# Patient Record
Sex: Female | Born: 1935 | Race: White | Hispanic: No | Smoking: Never smoker
Health system: Southern US, Community
[De-identification: ages and names within clinical notes are randomized; demographics above are authoritative.]

## PROBLEM LIST (undated history)

## (undated) DIAGNOSIS — R011 Cardiac murmur, unspecified: Secondary | ICD-10-CM

## (undated) DIAGNOSIS — I10 Essential (primary) hypertension: Secondary | ICD-10-CM

## (undated) DIAGNOSIS — K439 Ventral hernia without obstruction or gangrene: Secondary | ICD-10-CM

## (undated) DIAGNOSIS — I350 Nonrheumatic aortic (valve) stenosis: Secondary | ICD-10-CM

## (undated) DIAGNOSIS — E785 Hyperlipidemia, unspecified: Secondary | ICD-10-CM

## (undated) DIAGNOSIS — K219 Gastro-esophageal reflux disease without esophagitis: Secondary | ICD-10-CM

## (undated) HISTORY — PX: OTHER SURGICAL HISTORY: SHX169

## (undated) HISTORY — DX: Essential (primary) hypertension: I10

## (undated) HISTORY — DX: Nonrheumatic aortic (valve) stenosis: I35.0

## (undated) HISTORY — DX: Hyperlipidemia, unspecified: E78.5

## (undated) HISTORY — DX: Gastro-esophageal reflux disease without esophagitis: K21.9

## (undated) HISTORY — PX: ANTERIOR CERVICAL DECOMP/DISCECTOMY FUSION: SHX1161

## (undated) HISTORY — PX: CHOLECYSTECTOMY: SHX55

---

## 1998-05-12 ENCOUNTER — Other Ambulatory Visit: Admission: RE | Admit: 1998-05-12 | Discharge: 1998-05-12 | Payer: Self-pay | Admitting: Family Medicine

## 1999-04-11 ENCOUNTER — Encounter (INDEPENDENT_AMBULATORY_CARE_PROVIDER_SITE_OTHER): Payer: Self-pay | Admitting: Specialist

## 1999-04-11 ENCOUNTER — Ambulatory Visit (HOSPITAL_COMMUNITY): Admission: RE | Admit: 1999-04-11 | Discharge: 1999-04-11 | Payer: Self-pay | Admitting: Gastroenterology

## 2001-02-13 ENCOUNTER — Encounter: Admission: RE | Admit: 2001-02-13 | Discharge: 2001-02-13 | Payer: Self-pay | Admitting: Family Medicine

## 2001-02-13 ENCOUNTER — Encounter: Payer: Self-pay | Admitting: Family Medicine

## 2002-02-19 ENCOUNTER — Encounter: Payer: Self-pay | Admitting: Family Medicine

## 2002-02-19 ENCOUNTER — Encounter: Admission: RE | Admit: 2002-02-19 | Discharge: 2002-02-19 | Payer: Self-pay | Admitting: Family Medicine

## 2004-07-18 ENCOUNTER — Ambulatory Visit (HOSPITAL_COMMUNITY): Admission: RE | Admit: 2004-07-18 | Discharge: 2004-07-18 | Payer: Self-pay | Admitting: Gastroenterology

## 2006-08-11 ENCOUNTER — Emergency Department (HOSPITAL_COMMUNITY): Admission: EM | Admit: 2006-08-11 | Discharge: 2006-08-11 | Payer: Self-pay | Admitting: Emergency Medicine

## 2007-01-13 ENCOUNTER — Encounter: Admission: RE | Admit: 2007-01-13 | Discharge: 2007-01-13 | Payer: Self-pay | Admitting: *Deleted

## 2007-02-08 ENCOUNTER — Encounter (INDEPENDENT_AMBULATORY_CARE_PROVIDER_SITE_OTHER): Payer: Self-pay | Admitting: Specialist

## 2007-02-08 ENCOUNTER — Ambulatory Visit (HOSPITAL_COMMUNITY): Admission: RE | Admit: 2007-02-08 | Discharge: 2007-02-09 | Payer: Self-pay | Admitting: General Surgery

## 2010-06-27 ENCOUNTER — Ambulatory Visit: Payer: Self-pay | Admitting: Cardiology

## 2010-09-21 ENCOUNTER — Inpatient Hospital Stay (HOSPITAL_COMMUNITY): Admission: EM | Admit: 2010-09-21 | Discharge: 2010-09-12 | Payer: Self-pay | Admitting: Emergency Medicine

## 2010-12-26 LAB — CBC
HCT: 31.8 % — ABNORMAL LOW (ref 36.0–46.0)
HCT: 41.3 % (ref 36.0–46.0)
Hemoglobin: 13.9 g/dL (ref 12.0–15.0)
Hemoglobin: 14.1 g/dL (ref 12.0–15.0)
MCH: 32.1 pg (ref 26.0–34.0)
MCH: 32.9 pg (ref 26.0–34.0)
MCHC: 33.4 g/dL (ref 30.0–36.0)
MCHC: 34.2 g/dL (ref 30.0–36.0)
MCHC: 34.3 g/dL (ref 30.0–36.0)
MCV: 95.6 fL (ref 78.0–100.0)
MCV: 95.7 fL (ref 78.0–100.0)
MCV: 96.1 fL (ref 78.0–100.0)
Platelets: 226 10*3/uL (ref 150–400)
RBC: 3.53 MIL/uL — ABNORMAL LOW (ref 3.87–5.11)
RBC: 4.32 MIL/uL (ref 3.87–5.11)
RDW: 13.6 % (ref 11.5–15.5)
RDW: 13.9 % (ref 11.5–15.5)

## 2010-12-26 LAB — LIPID PANEL
Cholesterol: 203 mg/dL — ABNORMAL HIGH (ref 0–200)
HDL: 60 mg/dL (ref >39)
LDL Cholesterol: 127 mg/dL — ABNORMAL HIGH (ref 0–99)
Total CHOL/HDL Ratio: 3.4 RATIO
Triglycerides: 80 mg/dL (ref <150)
VLDL: 16 mg/dL (ref 0–40)

## 2010-12-26 LAB — POCT I-STAT, CHEM 8
Calcium, Ion: 1.06 mmol/L — ABNORMAL LOW (ref 1.12–1.32)
Chloride: 111 mEq/L (ref 96–112)
Creatinine, Ser: 0.9 mg/dL (ref 0.4–1.2)
Glucose, Bld: 186 mg/dL — ABNORMAL HIGH (ref 70–99)
Potassium: 3.7 mEq/L (ref 3.5–5.1)

## 2010-12-26 LAB — BASIC METABOLIC PANEL
BUN: 8 mg/dL (ref 6–23)
Chloride: 112 mEq/L (ref 96–112)
Glucose, Bld: 115 mg/dL — ABNORMAL HIGH (ref 70–99)
Potassium: 4.1 mEq/L (ref 3.5–5.1)

## 2010-12-26 LAB — DIFFERENTIAL
Basophils Absolute: 0 10*3/uL (ref 0.0–0.1)
Basophils Relative: 0 % (ref 0–1)
Eosinophils Relative: 0 % (ref 0–5)
Lymphocytes Relative: 18 % (ref 12–46)
Monocytes Absolute: 0.4 10*3/uL (ref 0.1–1.0)
Monocytes Relative: 4 % (ref 3–12)
Neutro Abs: 8.3 10*3/uL — ABNORMAL HIGH (ref 1.7–7.7)

## 2010-12-26 LAB — COMPREHENSIVE METABOLIC PANEL
ALT: 26 U/L (ref 0–35)
AST: 28 U/L (ref 0–37)
Albumin: 3.1 g/dL — ABNORMAL LOW (ref 3.5–5.2)
BUN: 16 mg/dL (ref 6–23)
CO2: 23 mEq/L (ref 19–32)
Calcium: 8.5 mg/dL (ref 8.4–10.5)
Calcium: 8.7 mg/dL (ref 8.4–10.5)
Chloride: 111 mEq/L (ref 96–112)
Creatinine, Ser: 0.97 mg/dL (ref 0.4–1.2)
Creatinine, Ser: 0.99 mg/dL (ref 0.4–1.2)
GFR calc Af Amer: >60 mL/min (ref >60)
GFR calc non Af Amer: 53 mL/min — ABNORMAL LOW (ref >60)
Glucose, Bld: 112 mg/dL — ABNORMAL HIGH (ref 70–99)
Sodium: 142 mEq/L (ref 135–145)
Sodium: 142 mEq/L (ref 135–145)
Total Bilirubin: 0.7 mg/dL (ref 0.3–1.2)

## 2010-12-26 LAB — PROTIME-INR
INR: 1.01 (ref 0.00–1.49)
Prothrombin Time: 13.5 seconds (ref 11.6–15.2)

## 2011-03-02 NOTE — Op Note (Signed)
[name], [name]              ACCOUNT NO.:  [id number]   MEDICAL RECORD NO.:  [id number]          PATIENT TYPE:  AMB   LOCATION:  ENDO                         FACILITY:  [facility]   PHYSICIAN:  Petra Kuba, M.D.    DATE OF BIRTH:  July 30, 1918   DATE OF PROCEDURE:  07/18/2004  DATE OF DISCHARGE:                                 OPERATIVE REPORT   PROCEDURE:  Colonoscopy.   INDICATIONS:  Patient with history of colon polyps.  Due for repeat  screening.  Consent was signed after risks, benefits, methods, options were  thoroughly discussed in the office multiple times in the past.   MEDICINES USED:  1.  Demerol 40.  2.  Versed 4.   PROCEDURE:  Rectal inspection was pertinent for external hemorrhoids, small.  Digital exam was negative.  Video pediatric adjustable colonoscope was  inserted and with various abdominal pressures able to be advanced around the  colon to the cecum.  No  abnormality was seen on insertion.  The cecum was  identified by the appendiceal orifice and the ileocecal valve.  The prep on  the right side did have some formed stool which could be washed and  suctioned to other areas of the colon.  Prep on the left was adequate.  On  slow withdraw through the colon, no abnormalities were seen, specifically no  polyps, tumors, masses, or diverticula.  Once back in the rectum, anorectal  pullthrough and retroflexion confirmed some small hemorrhoids.  The scope  was reinserted a short ways up the left side of the colon, air was  suctioned, and the scope removed.  The patient tolerated the procedure well.  There was no obvious immediate complication.   ENDOSCOPIC DIAGNOSES:  1.  Internal and external hemorrhoids.  2.  Otherwise within normal limits to the cecum.   PLAN:  I would be happy to see her back p.r.n.  Otherwise, return the  customary medical to Dr. Idell Pickles.  I doubt based on her age that she is a  candidate for repeat screening, but might consider it in five  years if doing  well, and thought she might live another 10 years.     MEM/MEDQ  D:  07/18/2004  T:  07/18/2004  Job:  [id number]   cc:   Dellis Anes. Idell Pickles, M.D.  [address]  [place]  [place] [address]  Fax: [phone]

## 2011-03-02 NOTE — Op Note (Signed)
[name], [name]              ACCOUNT NO.:  [id number]   MEDICAL RECORD NO.:  [id number]          PATIENT TYPE:  OIB   LOCATION:  0098                         FACILITY:  [facility]   PHYSICIAN:  Anselm Pancoast. Weatherly, M.D.DATE OF BIRTH:  06/13/1918   DATE OF PROCEDURE:  02/08/2007  DATE OF DISCHARGE:                               OPERATIVE REPORT   PREOPERATIVE DIAGNOSIS:  Chronic cholecystitis with stones.   OPERATION:  Laparoscopic cholecystectomy with cholangiogram. General  anesthesia.   HISTORY:  [name] is an 77 year old retired Engineer, site who  was referred through our office this past week with the following  symptoms. She has had several episodes of epigastric pain, one in I  think December, and she was actually hospitalization since it was within  her chest and had an extensive cardiac evaluation with no acute findings  noted. Afterwards, she was released and then had several episodes less  intense, and then approximately two weeks had an episode after eating at  [employer]. At this time, her regular medical physician sent her for an  ultrasound that showed she did have gallstones, and she was seen in our  office I think on Wednesday. Dr. Melvyn Novas is her regular medical  physician. On examination when I saw her on Wednesday, she was not  acutely ill, maybe mild tenderness in the upper abdomen but certainly  not any peritoneal findings. She is planned to care for her daughter's  children while her daughter goes on a cruise in approximately a week,  and since she has had repeated episodes of pain, I recommended that we  add her to the OR schedule for today, so hopefully she will be able to  manage the children and also unlikely to have another gallbladder attack  since she has had frequent episodes these past three months. The patient  this morning was not acutely ill.   Her laboratory studies showed basically a normal white cell count of  6800. Her hematocrit  is 39, and her liver function studies were all  normal. The patient was taken to the operating suite. She had been given  3 grams of Unasyn, had PAS stockings, positioned on the OR table and  anesthesia with endotracheal tube, an oral tube placed into the stomach,  and the abdomen was prepped with Betadine solution and draped in a  sterile manner. I do not think she has had any previous upper abdominal  surgeries, and a small incision was made below the umbilicus. Her fascia  is fairly thin but kind of a moderate adipose tissue, and we carefully  entered into the peritoneal cavity. A pursestring suture of 0 Vicryl was  placed and Hasson cannula introduced. The gallbladder was big and floppy  with adhesions around it but not acutely inflamed, and the upper 10-mm  trocar was placed in the subxiphoid area, and the two lateral 5 mm  placed laterally. The gallbladder was retracted upward and outward, and  the adhesions were carefully taken down. The duodenum was kind of  adherent to this, but with freeing up these chronic adhesions, I could  see  the cystic duct/gallbladder junction nicely. This was encompassed  with a right angle, a clip placed flush with the gallbladder,  cholangiocatheter entered proximally, and a cholangiogram obtained. The  biliary system is slightly dilated, but there is flow into the duodenum,  and there is no evidence of any stones in the distal common bile duct.  Her cystic duct is large enough that she certainly could have passed a  small gallstone when she was having the pain in December. The catheter  was removed. The cystic duct was triply clipped and then divided, and  the cystic artery was identified, doubly clipped proximally and singly  distally divided, and then the gallbladder was freed from its bed. There  was a little bleeding from the liver bed up at the very distal portion  that was likely coagulated, the gallbladder placed in an EndoCatch bag  and then  the cameras switched to the upper 10-mm port, and the bag  containing the gallbladder withdrawn. I put an additional figure-of-  eight suture of 0 Vicryl in the fascia at the umbilicus and anesthetized  it. The upper 10-mm trocar after irrigation all of the irrigating fluid  and all was removed. There was no evidence of any bleeding, and the 5-mm  ports were drawn, the carbon dioxide released, and the upper 10-mm  trocar withdrawn under direct vision. I then put a figure-of-eight  suture in the fascia at the subxiphoid area,  closed the subcutaneous wounds with 4-0 Vicryl, Benzoin and Steri-Strips  on the skin. The patient tolerated the procedure nicely and was  extubated and sent to the recovery room in stable postoperative  condition. We are going to keep her today, mainly because of her age,  and she should be able to be released in the a.m. Sunday.           ______________________________  Anselm Pancoast. Zachery Dakins, M.D.     WJW/MEDQ  D:  02/08/2007  T:  02/08/2007  Job:  [id number]   cc:   Melvyn Novas, M.D.   Anselm Pancoast. Zachery Dakins, M.D.  [address], [address]  [place]  [place] [address]

## 2011-03-13 ENCOUNTER — Emergency Department (HOSPITAL_COMMUNITY): Payer: Medicare Other

## 2011-03-13 ENCOUNTER — Inpatient Hospital Stay (HOSPITAL_COMMUNITY): Payer: Medicare Other

## 2011-03-13 ENCOUNTER — Emergency Department (HOSPITAL_COMMUNITY)
Admission: EM | Admit: 2011-03-13 | Discharge: 2011-03-13 | Disposition: A | Discharge status: Other Acute Inpatient Hospital | Payer: Medicare Other | Source: Home / Self Care | Attending: Emergency Medicine | Admitting: Emergency Medicine

## 2011-03-13 ENCOUNTER — Inpatient Hospital Stay (HOSPITAL_COMMUNITY)
Admission: EM | Admit: 2011-03-13 | Discharge: 2011-03-20 | DRG: 029 | Disposition: A | Discharge status: Rehabilitation Facility | Payer: Medicare Other | Attending: Neurosurgery | Admitting: Neurosurgery

## 2011-03-13 DIAGNOSIS — E785 Hyperlipidemia, unspecified: Secondary | ICD-10-CM | POA: Insufficient documentation

## 2011-03-13 DIAGNOSIS — Z79899 Other long term (current) drug therapy: Secondary | ICD-10-CM

## 2011-03-13 DIAGNOSIS — Y998 Other external cause status: Secondary | ICD-10-CM | POA: Insufficient documentation

## 2011-03-13 DIAGNOSIS — M25539 Pain in unspecified wrist: Secondary | ICD-10-CM | POA: Insufficient documentation

## 2011-03-13 DIAGNOSIS — W06XXXA Fall from bed, initial encounter: Secondary | ICD-10-CM | POA: Insufficient documentation

## 2011-03-13 DIAGNOSIS — K219 Gastro-esophageal reflux disease without esophagitis: Secondary | ICD-10-CM | POA: Diagnosis present

## 2011-03-13 DIAGNOSIS — I498 Other specified cardiac arrhythmias: Secondary | ICD-10-CM | POA: Diagnosis not present

## 2011-03-13 DIAGNOSIS — I959 Hypotension, unspecified: Secondary | ICD-10-CM | POA: Diagnosis not present

## 2011-03-13 DIAGNOSIS — I359 Nonrheumatic aortic valve disorder, unspecified: Secondary | ICD-10-CM | POA: Diagnosis present

## 2011-03-13 DIAGNOSIS — M4712 Other spondylosis with myelopathy, cervical region: Secondary | ICD-10-CM | POA: Diagnosis present

## 2011-03-13 DIAGNOSIS — Z85820 Personal history of malignant melanoma of skin: Secondary | ICD-10-CM

## 2011-03-13 DIAGNOSIS — M79609 Pain in unspecified limb: Secondary | ICD-10-CM | POA: Insufficient documentation

## 2011-03-13 DIAGNOSIS — R51 Headache: Secondary | ICD-10-CM | POA: Insufficient documentation

## 2011-03-13 DIAGNOSIS — K449 Diaphragmatic hernia without obstruction or gangrene: Secondary | ICD-10-CM | POA: Diagnosis present

## 2011-03-13 DIAGNOSIS — S12400A Unspecified displaced fracture of fifth cervical vertebra, initial encounter for closed fracture: Secondary | ICD-10-CM | POA: Insufficient documentation

## 2011-03-13 DIAGNOSIS — IMO0002 Reserved for concepts with insufficient information to code with codable children: Principal | ICD-10-CM | POA: Diagnosis present

## 2011-03-13 LAB — URINALYSIS, ROUTINE W REFLEX MICROSCOPIC
Bilirubin Urine: NEGATIVE
Glucose, UA: NEGATIVE mg/dL
Ketones, ur: NEGATIVE mg/dL
Leukocytes, UA: NEGATIVE
Nitrite: NEGATIVE
Protein, ur: NEGATIVE mg/dL
Specific Gravity, Urine: 1.014 (ref 1.005–1.030)
Urobilinogen, UA: 0.2 mg/dL (ref 0.0–1.0)
pH: 6 (ref 5.0–8.0)

## 2011-03-13 LAB — BASIC METABOLIC PANEL
BUN: 15 mg/dL (ref 6–23)
CO2: 24 mEq/L (ref 19–32)
CO2: 24 mEq/L (ref 19–32)
Calcium: 9.2 mg/dL (ref 8.4–10.5)
Calcium: 8.2 mg/dL — ABNORMAL LOW (ref 8.4–10.5)
Chloride: 106 mEq/L (ref 96–112)
Chloride: 102 mEq/L (ref 96–112)
Creatinine, Ser: 0.76 mg/dL (ref 0.4–1.2)
GFR calc Af Amer: >60 mL/min (ref >60)
Glucose, Bld: 135 mg/dL — ABNORMAL HIGH (ref 70–99)
Glucose, Bld: 149 mg/dL — ABNORMAL HIGH (ref 70–99)
Potassium: 3.6 mEq/L (ref 3.5–5.1)
Sodium: 137 mEq/L (ref 135–145)

## 2011-03-13 LAB — CARDIAC PANEL(CRET KIN+CKTOT+MB+TROPI)
CK, MB: 32.2 ng/mL (ref 0.3–4.0)
Relative Index: 1.7 (ref 0.0–2.5)
Total CK: 1937 U/L — ABNORMAL HIGH (ref 7–177)

## 2011-03-13 LAB — DIFFERENTIAL
Basophils Absolute: 0 K/uL (ref 0.0–0.1)
Basophils Relative: 0 % (ref 0–1)
Eosinophils Absolute: 0 10*3/uL (ref 0.0–0.7)
Eosinophils Relative: 0 % (ref 0–5)
Lymphocytes Relative: 7 % — ABNORMAL LOW (ref 12–46)
Lymphs Abs: 1.1 10*3/uL (ref 0.7–4.0)
Monocytes Absolute: 1.1 10*3/uL — ABNORMAL HIGH (ref 0.1–1.0)
Monocytes Relative: 6 % (ref 3–12)
Neutro Abs: 14.6 K/uL — ABNORMAL HIGH (ref 1.7–7.7)
Neutrophils Relative %: 87 % — ABNORMAL HIGH (ref 43–77)

## 2011-03-13 LAB — BASIC METABOLIC PANEL WITH GFR
GFR calc Af Amer: >60 mL/min (ref >60)
GFR calc non Af Amer: >60 mL/min (ref >60)
Potassium: 3.4 meq/L — ABNORMAL LOW (ref 3.5–5.1)
Sodium: 142 meq/L (ref 135–145)

## 2011-03-13 LAB — CBC
HCT: 37.8 % (ref 36.0–46.0)
Hemoglobin: 12.7 g/dL (ref 12.0–15.0)
MCH: 30.7 pg (ref 26.0–34.0)
MCHC: 33.6 g/dL (ref 30.0–36.0)
MCV: 91.3 fL (ref 78.0–100.0)
Platelets: 236 10*3/uL (ref 150–400)
RBC: 4.14 MIL/uL (ref 3.87–5.11)
RDW: 14.3 % (ref 11.5–15.5)
WBC: 16.9 K/uL — ABNORMAL HIGH (ref 4.0–10.5)

## 2011-03-13 LAB — SAMPLE TO BLOOD BANK

## 2011-03-13 LAB — URINE MICROSCOPIC-ADD ON

## 2011-03-13 LAB — PROTIME-INR: INR: 0.97 (ref 0.00–1.49)

## 2011-03-13 LAB — PHOSPHORUS: Phosphorus: 3.6 mg/dL (ref 2.3–4.6)

## 2011-03-14 DIAGNOSIS — I498 Other specified cardiac arrhythmias: Secondary | ICD-10-CM

## 2011-03-14 DIAGNOSIS — I959 Hypotension, unspecified: Secondary | ICD-10-CM

## 2011-03-14 LAB — LACTIC ACID, PLASMA: Lactic Acid, Venous: 1 mmol/L (ref 0.5–2.2)

## 2011-03-14 LAB — BLOOD GAS, ARTERIAL
Acid-base deficit: 0.7 mmol/L (ref 0.0–2.0)
O2 Saturation: 96 %
TCO2: 24.7 mmol/L (ref 0–100)
pCO2 arterial: 39.5 mmHg (ref 35.0–45.0)

## 2011-03-14 LAB — TSH: TSH: 0.658 u[IU]/mL (ref 0.350–4.500)

## 2011-03-15 DIAGNOSIS — IMO0002 Reserved for concepts with insufficient information to code with codable children: Secondary | ICD-10-CM

## 2011-03-15 LAB — CARDIAC PANEL(CRET KIN+CKTOT+MB+TROPI)
CK, MB: 5 ng/mL — ABNORMAL HIGH (ref 0.3–4.0)
Relative Index: 0.4 (ref 0.0–2.5)
Troponin I: <0.3 ng/mL (ref <0.30)

## 2011-03-15 LAB — CBC
Hemoglobin: 10.5 g/dL — ABNORMAL LOW (ref 12.0–15.0)
MCV: 92.4 fL (ref 78.0–100.0)
Platelets: 211 10*3/uL (ref 150–400)
RBC: 3.29 MIL/uL — ABNORMAL LOW (ref 3.87–5.11)
WBC: 15.1 10*3/uL — ABNORMAL HIGH (ref 4.0–10.5)

## 2011-03-15 LAB — CORTISOL: Cortisol, Plasma: 20.4 ug/dL

## 2011-03-15 LAB — BASIC METABOLIC PANEL
BUN: 11 mg/dL (ref 6–23)
Creatinine, Ser: 0.59 mg/dL (ref 0.4–1.2)
GFR calc non Af Amer: >60 mL/min (ref >60)

## 2011-03-16 DIAGNOSIS — I498 Other specified cardiac arrhythmias: Secondary | ICD-10-CM

## 2011-03-19 ENCOUNTER — Inpatient Hospital Stay (HOSPITAL_COMMUNITY): Payer: Medicare Other

## 2011-03-20 ENCOUNTER — Inpatient Hospital Stay (HOSPITAL_COMMUNITY)
Admission: RE | Admit: 2011-03-20 | Discharge: 2011-04-03 | DRG: 945 | Disposition: A | Discharge status: Home Health | Payer: Medicare Other | Source: Other Acute Inpatient Hospital | Attending: Physical Medicine & Rehabilitation | Admitting: Physical Medicine & Rehabilitation

## 2011-03-20 DIAGNOSIS — I4891 Unspecified atrial fibrillation: Secondary | ICD-10-CM

## 2011-03-20 DIAGNOSIS — K5900 Constipation, unspecified: Secondary | ICD-10-CM

## 2011-03-20 DIAGNOSIS — N390 Urinary tract infection, site not specified: Secondary | ICD-10-CM

## 2011-03-20 DIAGNOSIS — K219 Gastro-esophageal reflux disease without esophagitis: Secondary | ICD-10-CM

## 2011-03-20 DIAGNOSIS — N319 Neuromuscular dysfunction of bladder, unspecified: Secondary | ICD-10-CM

## 2011-03-20 DIAGNOSIS — I359 Nonrheumatic aortic valve disorder, unspecified: Secondary | ICD-10-CM

## 2011-03-20 DIAGNOSIS — E785 Hyperlipidemia, unspecified: Secondary | ICD-10-CM

## 2011-03-20 DIAGNOSIS — Z5189 Encounter for other specified aftercare: Secondary | ICD-10-CM

## 2011-03-20 DIAGNOSIS — Z981 Arthrodesis status: Secondary | ICD-10-CM

## 2011-03-20 DIAGNOSIS — I959 Hypotension, unspecified: Secondary | ICD-10-CM

## 2011-03-20 DIAGNOSIS — B964 Proteus (mirabilis) (morganii) as the cause of diseases classified elsewhere: Secondary | ICD-10-CM

## 2011-03-20 DIAGNOSIS — K592 Neurogenic bowel, not elsewhere classified: Secondary | ICD-10-CM

## 2011-03-20 DIAGNOSIS — W06XXXA Fall from bed, initial encounter: Secondary | ICD-10-CM

## 2011-03-20 DIAGNOSIS — I498 Other specified cardiac arrhythmias: Secondary | ICD-10-CM

## 2011-03-20 DIAGNOSIS — E876 Hypokalemia: Secondary | ICD-10-CM

## 2011-03-20 DIAGNOSIS — IMO0002 Reserved for concepts with insufficient information to code with codable children: Secondary | ICD-10-CM

## 2011-03-20 LAB — URINALYSIS, MICROSCOPIC ONLY
Glucose, UA: NEGATIVE mg/dL
Ketones, ur: NEGATIVE mg/dL
Protein, ur: 30 mg/dL — AB
Urobilinogen, UA: 0.2 mg/dL (ref 0.0–1.0)

## 2011-03-21 DIAGNOSIS — K592 Neurogenic bowel, not elsewhere classified: Secondary | ICD-10-CM

## 2011-03-21 DIAGNOSIS — N319 Neuromuscular dysfunction of bladder, unspecified: Secondary | ICD-10-CM

## 2011-03-21 DIAGNOSIS — IMO0002 Reserved for concepts with insufficient information to code with codable children: Secondary | ICD-10-CM

## 2011-03-21 DIAGNOSIS — I4891 Unspecified atrial fibrillation: Secondary | ICD-10-CM

## 2011-03-21 DIAGNOSIS — Z5189 Encounter for other specified aftercare: Secondary | ICD-10-CM

## 2011-03-21 LAB — COMPREHENSIVE METABOLIC PANEL
ALT: 26 U/L (ref 0–35)
AST: 16 U/L (ref 0–37)
CO2: 29 mEq/L (ref 19–32)
Chloride: 106 mEq/L (ref 96–112)
GFR calc Af Amer: >60 mL/min (ref >60)
GFR calc non Af Amer: >60 mL/min (ref >60)
Glucose, Bld: 90 mg/dL (ref 70–99)
Sodium: 143 mEq/L (ref 135–145)
Total Bilirubin: 0.3 mg/dL (ref 0.3–1.2)

## 2011-03-21 LAB — CBC
HCT: 30.9 % — ABNORMAL LOW (ref 36.0–46.0)
Hemoglobin: 10.3 g/dL — ABNORMAL LOW (ref 12.0–15.0)
MCH: 31.1 pg (ref 26.0–34.0)
RBC: 3.31 MIL/uL — ABNORMAL LOW (ref 3.87–5.11)

## 2011-03-21 LAB — DIFFERENTIAL
Basophils Absolute: 0.1 10*3/uL (ref 0.0–0.1)
Basophils Relative: 0 % (ref 0–1)
Lymphocytes Relative: 24 % (ref 12–46)
Monocytes Absolute: 1.3 10*3/uL — ABNORMAL HIGH (ref 0.1–1.0)
Monocytes Relative: 11 % (ref 3–12)
Neutro Abs: 7.1 10*3/uL (ref 1.7–7.7)
Neutrophils Relative %: 61 % (ref 43–77)

## 2011-03-22 LAB — BASIC METABOLIC PANEL
Chloride: 106 mEq/L (ref 96–112)
GFR calc Af Amer: >60 mL/min (ref >60)
Potassium: 3.5 mEq/L (ref 3.5–5.1)
Sodium: 141 mEq/L (ref 135–145)

## 2011-03-22 LAB — URINE CULTURE

## 2011-03-23 DIAGNOSIS — IMO0002 Reserved for concepts with insufficient information to code with codable children: Secondary | ICD-10-CM

## 2011-03-23 DIAGNOSIS — N319 Neuromuscular dysfunction of bladder, unspecified: Secondary | ICD-10-CM

## 2011-03-23 DIAGNOSIS — I4891 Unspecified atrial fibrillation: Secondary | ICD-10-CM

## 2011-03-23 DIAGNOSIS — K592 Neurogenic bowel, not elsewhere classified: Secondary | ICD-10-CM

## 2011-03-23 DIAGNOSIS — Z5189 Encounter for other specified aftercare: Secondary | ICD-10-CM

## 2011-03-25 NOTE — H&P (Signed)
[name], [name]              ACCOUNT NO.:  [id number]  MEDICAL RECORD NO.:  [id number]           PATIENT TYPE:  I  LOCATION:  3109                         FACILITY:  [facility]  PHYSICIAN:  Cristi Loron, M.D.DATE OF BIRTH:  September 04, 1918  DATE OF ADMISSION:  03/13/2011 DATE OF DISCHARGE:                             HISTORY & PHYSICAL   CHIEF COMPLAINT:  Neck pain.  HISTORY OF PRESENT ILLNESS:  The patient is a 77 year old white female who has fell out of bed at approximately 03:30 this morning.  There was no loss of consciousness.  The patient's initial report describing "quadriplegia."  She eventually regained some strength in her legs, but continued to have significant arm weakness today at about 5 making her way to the phone.  She called EMS, when she was taken to the [facility] where CT scan demonstrated a C5-C6 fracture subluxation.  The patient was transferred to [facility] and admitted and Neurosurgical consultation was requested.  Presently, the patient is alert.  She complains of neck pain.  She says "my hands are killing me," describing burning pain in her bilateral hands, particularly in her bilateral palms.  She says her strength had returned in her legs, but feels her arms and hands are weak, right greater than left.  She denies chest pain, abdominal pain, shortness of breath, low back pain, seizures, nausea, vomiting, etc.  PAST MEDICAL HISTORY:  Positive for melanoma of lower extremity, hiatal hernia, "irregular heartbeat," psoriasis, remote history of cholecystitis, cataracts, lower extremity edema, and gastroesophageal reflux disease.  PAST SURGICAL HISTORY:  Hysterectomy, cholecystectomy, appendectomy, and cataracts.  FAMILY HISTORY:  Noncontributory.  SOCIAL HISTORY:  The patient is divorced.  She lives alone.  She has 2 children.  She lives in [place].  She denies tobacco, ethanol, and drug abuse.  REVIEW OF SYSTEMS:   Negative as stated above.  PHYSICAL EXAMINATION:  GENERAL:  A pleasant 77 year old white female in a cervical collar complaining of neck, arm, and hand pain. HEENT:  Normocephalic.  Pupils are equal, round, and reactive to light. She has bilateral lens implant.  Extraocular muscles intact.  Oropharynx is dry. NECK:  Supple without any masses or obvious deformity.  She is in a cervical collar.  There is no tracheal deviation or jugular distention. CHEST:  Thorax symmetric. ABDOMEN:  Soft. HEART:  Regular rhythm. EXTREMITIES:  No obvious deformities. BACK:  Unremarkable. NEUROLOGIC:  The patient is alert and oriented x3.  Glasgow coma scale 15.  Cranial nerves II through XII bilaterally grossly normal. Cerebellar function was unable to be tested.  The patient has burning dysesthesias in bilateral upper extremities and hands, right greater than left.  Deep tendon reflexes are 1/4 in the biceps and triceps, 2/5 in quadriceps and absent in gastrocnemius.  There is no ankle clonus. Her motor strength is 5/5 in bilateral quadriceps, gastrocnemius, dorsiflexors, and deltoids.  Her right biceps strength is 4-/5, right triceps strength is 3/5, right ankle strength is 4-/5, right wrist extensor strength is 3 to 4-/5.  Left biceps strength is 4/5, triceps strength is 4-/5.  Left hand grip strength is 4/5  and left wrist extensor strength is 4/5.  IMAGING STUDIES:  I have reviewed the patient's head CT performed without contrast at [facility] today that is unremarkable except for some age-appropriate brain atrophy.  I also reviewed the patient's cervical CT performed in [facility] today demonstrates the patient has a C5-C6 fracture subluxation with a right locked facet and left partial facet.  She has resultant significant spinal stenosis.  She otherwise has age-appropriate degenerative changes.  ASSESSMENT AND PLAN:  C5-C6 fracture subluxation, central cord  syndrome, spinal cord injury.  I discussed the procedure with the patient and her son .  We discussed her treatment options, and they include doing nothing, cervical othosis, surgery, etc.  I have recommended she undergo a C5-C6 anterior cervical discectomy and Bryan arthrodesis prosthesis with anterior cervical plating.  I have described this surgery to them. I have also told them that she may also have to have posterior surgery as well for the further stabilization.  I had discussed the risk of surgery include, risk of anesthesia, hemorrhage, infection, medical complications, injury to the various neck structures, swallowing difficulties, fusion failure, instrumentation failure, spinal cord injury, etc.  I have answered all her question.  She would like to proceed with surgery.     Cristi Loron, M.D.     JDJ/MEDQ  D:  03/13/2011  T:  03/13/2011  Job:  [id number]  Electronically Signed by Tressie Stalker M.D. on 03/25/2011 10:00:10 PM

## 2011-03-25 NOTE — Op Note (Signed)
[name], [name]              ACCOUNT NO.:  [id number]  MEDICAL RECORD NO.:  [id number]           PATIENT TYPE:  I  LOCATION:  3109                         FACILITY:  [facility]  PHYSICIAN:  Cristi Loron, M.D.DATE OF BIRTH:  Dec 28, 1917  DATE OF PROCEDURE:  03/13/2011 DATE OF DISCHARGE:                              OPERATIVE REPORT   BRIEF HISTORY:  The patient is a 77 year old white female who fell out of bed suffering a C5-6 fracture subluxation with spinal cord injury.  I discussed various treatment with the patient and her son including surgery.  She has weighed the risks, benefits and alternatives of surgery and they decided to proceed with a C5-6 surgery.  PREOPERATIVE DIAGNOSES:  C5-6 fracture subluxation, spinal cord injury, spinal stenosis, cervicalgia, cervical radiculopathy/myelopathy.  POSTOPERATIVE DIAGNOSES:  C5-6 fracture subluxation, spinal cord injury, spinal stenosis, cervicalgia, cervical radiculopathy/myelopathy.  PROCEDURE:  Open reduction and internal fixation C5-6 fracture subluxation; C5-6 extensive anterior cervical diskectomy/decompression; C5-6 anterior interbody arthrodesis with local morselized autograft bone and Actifuse bone graft extender; insertion of C5-6 interbody prosthesis (PEEK interbody prosthesis); C5-6 anterior cervical plating with Globus titanium plate and screws.  SURGEON:  Cristi Loron, MD  ASSISTANT:  Hilda Lias, MD  ANESTHESIA:  General endotracheal.  ESTIMATED BLOOD LOSS:  50 mL.  SPECIMENS:  None.  DRAINS:  None.  COMPLICATIONS:  None.  DESCRIPTION OF PROCEDURE:  The patient was brought to the operating room by the Anesthesia Team.  General endotracheal anesthesia was induced with careful midline traction in a neutral position.  The patient's neck was supported in the neutral position with doughnut or head tape in place.  Her anterior cervical region was then prepared with Betadine scrub and Betadine  solution.  Sterile drapes were applied and then injected the area to be incised with Marcaine with epinephrine solution. I used a scalpel to make a transverse incision in the patient's left anterior neck.  I used Metzenbaum scissors to divide the platysma muscle and then to dissect the medial to sternocleidomastoid muscle, jugular vein, and carotid artery.  I carefully dissected down towards the anterior cervical spine identifying the esophagus and retracting it medially.  I used Kittner swabs to clear the soft tissue from the anterior cervical spine.  The position was plainly obvious at C5-6.  We obtained intraoperative radiograph that to be double sure.  I then used electrocautery to detach the medial border of the longus colli muscle bilaterally from C5-6 interspace.  I then inserted the Caspar self- retaining retractor underneath the longus colli muscle bilaterally to provide exposure.  I then incised the C5-6 intervertebral disk with a 15-blade scalpel. The periosteum was elevated over the C6 vertebral body.  I performed apartial diskectomy using the pituitary forceps.  I then inserted distraction screws at C5 and C6 and then distracted interspace and doing so with some manipulation where we were able to reduce the patient's fracture subluxation.  I then completed the diskectomy using the pituitary forceps and a high-speed drill.  I undercut the vertebral endplates at C5-6 decompressing thecal sac and then performed foraminotomies about the bilateral C6 nerve roots completing  the decompression.  We now turned our attention to arthrodesis.  We used trial spacer to determine to use an 8-mm interbody prosthesis.  I have prefilled prosthesis with combination of a local autograft bone we obtained during the decompression as well as Actifuse bone graft extender.  I then inserted the prosthesis in distraction C5-6 interspace and then removed the distraction screws.  There was a good snug  fit of the prosthesis in the interspace.  We now turned our attention to the anterior spinal instrumentation.  We had planned the appropriate length of Globus titanium plate and laid it along the anterior aspect of the vertebral bodies (after using high- speed drill we removed some ventral spondylosis from the vertebral endplates with the plate would lay down flat).  I then drilled two 12-mm holes at C5-C6 and secured the plate to the vertebral bodies by placing two 12-mm self-tapping screws at C5-C6.  We got good bony purchase. When then obtained intraoperative radiograph demonstrating good position of upper plates, screws, it was a limited visualization of lower plates, screws, but they  looked good in vivo.  We therefore secured the screws and plate by locking each cam.  This completed instrumentation.  We then irrigated the wound out with bacitracin solution.  We obtained hemostasis using bipolar electrocautery.  We removed the retractor.  We inspected the esophagus for damage, there was none noted.  We then reapproximated the patient's platysma muscle with interrupted 3-0 Vicryl suture, subcutaneous tissue with interrupted 3-0 Vicryl suture and the skin with Steri-Strips and Benzoin.  The wound was then coated with bacitracin ointment.  A sterile dressing was applied.  The drapes were removed and the patient was subsequently placed back in her cervical collar.  All sponge, instrument and needle counts were correct at the end of the case.  The patient was subsequently extubated by the Anesthesia Team and transported to Post Anesthesia Care Unit in stable condition.  All sponge, instrument, and needle counts were correct at the end of the case.     Cristi Loron, M.D.     JDJ/MEDQ  D:  03/13/2011  T:  03/14/2011  Job:  [id number]  Electronically Signed by Tressie Stalker M.D. on 03/25/2011 10:00:30 PM

## 2011-03-26 NOTE — Consult Note (Addendum)
[name], [name]              ACCOUNT NO.:  [id number]  MEDICAL RECORD NO.:  [id number]  LOCATION:  3010                         FACILITY:  [facility]  PHYSICIAN:  Vesta Mixer, M.D. DATE OF BIRTH:  October 06, 1918  DATE OF CONSULTATION:  03/16/2011 DATE OF DISCHARGE:                                CONSULTATION   PRIMARY CARDIOLOGIST:  Peter M. Swaziland, MD  PRIMARY MEDICAL DOCTOR:  Dr. Juluis Rainier.  REASON FOR CONSULTATION:  Bradycardia.  CHIEF COMPLAINT:  Fall.  HISTORY OF PRESENT ILLNESS:  [name] is a very pleasant 77 year old female with history of moderate AS, prior intraoperative bradycardia, and occasional lower extremity edema with no known history of coronary artery disease.  Her last echo was in 2010 showing an EF of 55-60% with moderate AS, mild AR, and diastolic dysfunction.  She was admitted with a fall.  She typically gets up in the middle of the night to go to the bathroom, while doing so I think she may have gotten her feet tangled up in the bedsheet and fell on the ground out of bed.  She felt some upper and lower extremity weakness afterwards, but was able to make her way slowly to the phone where she called EMS.  She was taken to [facility].  CT scan demonstrated C5-C6 fracture subluxation, and she was subsequently transferred to [facility].  She is status post open reduction and internal fixation C5-C6, with C5-C6 diskectomy, and decompression on Mar 13, 2011.  Postoperatively, there is mention that she had bradycardia and hypotension noted on Mar 14, 2011, which responded to hydrocortisone dopamine.  She was on dopamine on Mar 15, 2011.  No cardiopulmonary critical care mentioned, occasional 2+ seconds pause of bradycardia into the mid 40s yesterday.  She was transferred to 3000 today, so we are unable to review the telemetry from her ICU stay. However, strips are available on the chart, which were reviewed showing an episode of  pauses causing PVC x2 approximately 1.8 seconds, first- degree A-V block, and a late night trip after 11 o'clock with the heart rate in the mid to low 40s.  The patient was asymptomatic during these times per her report, there is no mention of neurologic changes in her record.  Since in 3000, she has had occasional ventricular ectopy/aberrant sinus rhythm beats with nonsustained runs of ventricular ectopy.  She denies any chest pain, shortness of breath, or syncope. She states she has been told she has skipped heartbeats for years.  PAST MEDICAL HISTORY: 1. Melanoma of lower extremity. 2. Hiatal hernia. 3. Psoriasis. 4. Lower extremity edema, takes occasional Lasix. 5. Cataracts. 6. Bradycardia in the past following surgery. 7. Moderate aortic stenosis, followed by Dr. Swaziland with most recent     echo 2010 with above findings. 8. Dyslipidemia. 9. Cholecystectomy. 10.Appendectomy. 11.Hysterectomy.  OUTPATIENT MEDICATIONS: 1. Zetia 10 mg daily. 2. Lasix 20 mg p.r.n. 3. Nexium 40 mg daily. 4. Albuterol p.r.n. 5. Claritin p.r.n. 6. Tylenol. 7. Gas-X. 8. Vitamin D3.  INPATIENT MEDICATIONS: 1. Colace 300 mg q.8 h. 2. Solu-Cortef 50 mg IV q.8 h. 3. LR 20 mL/hour.  ALLERGIES:  IODINE.  SOCIAL HISTORY:  [name]  lives in [place] alone.  Her daughter looks her occasionally, and she has two children to look.  She denies any tobacco, alcohol, or drug use.  She is a retired Runner, broadcasting/film/video.  FAMILY HISTORY:  Negative for coronary artery disease.  REVIEW OF SYSTEMS:  No fevers, chills.  She has noted recent memory loss, which she was going to talk to her PCP although, manifested in the form of simply forgetting things in the short term, which she attributes to her old age.  All other systems reviewed and otherwise negative except for those noted in the HPI.  LABORATORY DATA:  WBC 15.1, hemoglobin 10.5, hematocrit 30.4, platelet count 211.  Sodium 142, potassium 3.8, chloride 109,  CO2 of 25, glucose 148, BUN 11, creatinine 0.59.  TSH within normal limits.  CK 1937 and 1257 with MB 32.6 and 5.02.  Troponin is negative.  Relative index negative.  Cholesterol is okay.  UA showed small blood.  RADIOLOGIC STUDIES: 1. CT of the C-spine showed postop changes from C5-6 to ACDF. 2. Pain is scant and otherwise unremarkable. 3. Chest x-ray, Mar 13, 2011, showed cardiomegaly without acute     process. 4. CT of the head showed negative acute intracranial abnormality. 5. CT of the C-spine showed unstable recurrent C-spine injury C5-C6     with spinal stenosis and paravertebral edema and hematoma.  EKG, normal sinus rhythm with a first-degree A-V block, PR interval 284, otherwise acute changes.  PHYSICAL EXAMINATION:  VITAL SIGNS:  Temperature 36.6, pulse 74, respirations 20, blood pressure 91/34 and 135/51, pulse ox 93%. GENERAL:  This is a pleasant white female, in no acute distress elderly in nature. HEENT:  Normocephalic, atraumatic with extraocular movements intact. Clear sclerae.  Nares without discharge. NECK:  In a C-collar, unable to auscultate for bruits. HEART:  Auscultation of the heart reveals regular rate and rhythm with S1 and S2 with a 2/6 soft systolic ejection murmur at the right upper sternal border. LUNGS:  Clear to auscultation bilaterally without wheezes or rhonchi. ABDOMEN:  Soft, nontender, nondistended with positive bowel sounds. EXTREMITIES:  Warm and dry without edema. NEUROLOGIC:  She is alert and oriented x3, responds to questions appropriately with a normal affect is quite sharp given her age.  ASSESSMENT/PLAN:  The patient was seen and examined by Dr. Elease Hashimoto and myself.  This is a 77 year old female with a history of moderate aortic stenosis, bradycardia in the past at time of surgery, normal LV function by echo in 2010 who presented to [facility] with mechanical fall, that after having gotten to her feet twisted in the  sheet, subsequently sustaining a C5-56 injury requiring open reduction and internal fixation, diskectomy, and decompression.  Postoperatively, she had bradycardia and hypotension requiring pressors, which has since resolved.  We are asked to see her for bradycardia.  Unfortunately, she has since been moved to telemetry floor from the ICU, and limited strips are available.  She does have an episode of two pauses following normal sinus beats, and a premature ventricular contraction, lasting no more than 2 seconds.  She has non-sustained bradycardia in the mid 40s overnight, and was asymptomatic with this.  Currently, she is exhibiting normal sinus rhythm with first-degree A-V block, and occasional ventricular ectopy on telemetry.  She remains with the stable blood pressure.  At this time, we believe that conservative management is best as she is asymptomatic in the setting of these episodes here in the hospital.  We recommend to keep her electrolytes with a  K greater than 4.  We will not do any other intervention at this time.  She has syncope or becomes symptomatic with her bradycardia.  We may have to revisit this for now.  Otherwise, no further recommendations at this time. Please call for questions.  Thank you for the opportunity to participate in the care of this patient.     Ronie Spies, P.A.C.   ______________________________ Vesta Mixer, M.D.    DD/MEDQ  D:  03/16/2011  T:  03/17/2011  Job:  [id number]  cc:   Peter M. Swaziland, M.D. Dr. Juluis Rainier  Electronically Signed by Ronie Spies  on 03/29/2011 09:25:40 AM Electronically Signed by Kristeen Miss M.D. on 04/05/2011 09:15:24 AM

## 2011-03-27 DIAGNOSIS — IMO0002 Reserved for concepts with insufficient information to code with codable children: Secondary | ICD-10-CM

## 2011-03-27 DIAGNOSIS — I4891 Unspecified atrial fibrillation: Secondary | ICD-10-CM

## 2011-03-27 DIAGNOSIS — K592 Neurogenic bowel, not elsewhere classified: Secondary | ICD-10-CM

## 2011-03-27 DIAGNOSIS — N319 Neuromuscular dysfunction of bladder, unspecified: Secondary | ICD-10-CM

## 2011-03-27 DIAGNOSIS — Z5189 Encounter for other specified aftercare: Secondary | ICD-10-CM

## 2011-03-27 LAB — BASIC METABOLIC PANEL
CO2: 28 mEq/L (ref 19–32)
Chloride: 106 mEq/L (ref 96–112)
GFR calc non Af Amer: >60 mL/min (ref >60)
Glucose, Bld: 103 mg/dL — ABNORMAL HIGH (ref 70–99)
Potassium: 2.8 mEq/L — ABNORMAL LOW (ref 3.5–5.1)
Sodium: 143 mEq/L (ref 135–145)

## 2011-03-30 DIAGNOSIS — I4891 Unspecified atrial fibrillation: Secondary | ICD-10-CM

## 2011-03-30 DIAGNOSIS — IMO0002 Reserved for concepts with insufficient information to code with codable children: Secondary | ICD-10-CM

## 2011-03-30 DIAGNOSIS — N319 Neuromuscular dysfunction of bladder, unspecified: Secondary | ICD-10-CM

## 2011-03-30 DIAGNOSIS — Z5189 Encounter for other specified aftercare: Secondary | ICD-10-CM

## 2011-03-30 DIAGNOSIS — K592 Neurogenic bowel, not elsewhere classified: Secondary | ICD-10-CM

## 2011-04-02 DIAGNOSIS — K592 Neurogenic bowel, not elsewhere classified: Secondary | ICD-10-CM

## 2011-04-02 DIAGNOSIS — I4891 Unspecified atrial fibrillation: Secondary | ICD-10-CM

## 2011-04-02 DIAGNOSIS — N319 Neuromuscular dysfunction of bladder, unspecified: Secondary | ICD-10-CM

## 2011-04-02 DIAGNOSIS — Z5189 Encounter for other specified aftercare: Secondary | ICD-10-CM

## 2011-04-02 DIAGNOSIS — IMO0002 Reserved for concepts with insufficient information to code with codable children: Secondary | ICD-10-CM

## 2011-04-02 LAB — BASIC METABOLIC PANEL
CO2: 26 mEq/L (ref 19–32)
Chloride: 109 mEq/L (ref 96–112)
GFR calc non Af Amer: >60 mL/min (ref >60)
Glucose, Bld: 100 mg/dL — ABNORMAL HIGH (ref 70–99)
Potassium: 4 mEq/L (ref 3.5–5.1)
Sodium: 143 mEq/L (ref 135–145)

## 2011-04-10 NOTE — H&P (Signed)
NAMEDEANGELA, [name]              ACCOUNT NO.:  [id number]  MEDICAL RECORD NO.:  [id number]  LOCATION:  4009                         FACILITY:  [facility]  PHYSICIAN:  Ranelle Oyster, M.D.DATE OF BIRTH:  1918-04-08  DATE OF ADMISSION:  03/20/2011 DATE OF DISCHARGE:                             HISTORY & PHYSICAL   CHIEF COMPLAINTS:  Upper extremity weakness.  HISTORY OF PRESENT ILLNESS:  This is a pleasant 77 year old white female who with fell out of bed on Mar 13, 2011.  CT showed a C5-C6 fracture with subluxation as well as spinal stenosis.  She underwent ORIF of the C5-C6 fracture with anterior cervical diskectomy with decompression on Mar 13, 2011, by Dr. Lovell Sheehan.  She was treated with a cervical collar. Postoperatively, she had hypotension and bradycardia, and weaned from dopamine drip.  Cardiology was consulted secondary to bradycardia. Cardiac panel was negative.  The patient has been asymptomatic.  Staff was advised to monitor closely.  She was on IV hydrocortisone q.a.c. for blood pressure control with plan to taper.  Rehab was asked to evaluate this patient.  They felt she could benefit from an inpatient rehab stay.  REVIEW OF SYSTEMS:  Notable for lower extremity edema and reflux.  She has had constipation as well as some urine incontinence.  Full 12-point review is in the written H and P.  PAST MEDICAL HISTORY:  Positive for: 1. Hyperlipidemia. 2. GERD. 3. Moderate aortic stenosis. 4. Cholecystectomy. 5. Appendectomy. 6. Cataract surgery.  Denies alcohol or tobacco use.  FAMILY HISTORY:  Positive for CAD.  SOCIAL HISTORY:  The patient lives alone.  Local family works. Granddaughter plans to assist if needed.  She has a one-level house with four steps to enter.  Plans to hire help if needed.  Daughter can work from home if needed.  ALLERGIES:  CODEINE.  HOME MEDICATIONS AND LABS:  Please see written health H and P.  Most recent white count was  15,000.  PHYSICAL EXAM:  VITAL SIGNS:  Blood pressure 153/65, pulse is 50, respiratory rate 20, temperature 98.7. GENERAL:  The patient is pleasant, alert, and oriented x3. HEENT:  Pupils are equally round and reactive to light.  Nose and throat exam is unremarkable. NECK:  Supple without JVD or lymphadenopathy.  Cervical scar is well healing.  He has Aspen collar in place. CHEST:  Clear to auscultation bilaterally without wheezes, rales, or rhonchi. HEART:  Regular rhythm with bradycardia. EXTREMITIES:  No clubbing or cyanosis.  Only trace edema. ABDOMEN:  Soft and nontender. SKIN:  Generally intact except for as noted above.  She did have some psoriasis spots on the legs. NEUROLOGIC:  Cranial nerves II-XII are grossly intact.  Reflexes 1+. Sensation is diminished in the hands bilaterally.  She also has some altered sensorium in the arms bilaterally.  Arms are hypersensitive to touch, but not frankly allodynic.  Judgment, orientation, memory, and mood were all appropriate.  Strength is 3+/5, deltoid 3/5, 3+/5 right biceps, 4/5 left biceps, she is 2+/5 left triceps, 3/5 right triceps, right hand intrinsics are 2+/5, left hand intrinsics are 3 to 3+/5. Lower extremity strength is grossly 4-5/5.  POST ADMISSION PHYSICIAN EVALUATION: 1. Functional deficit secondary  to cervical 5/6 fracture subluxation     and central cord injury. 2. The patient is admitted to receive collaborative interdisciplinary     care between the physiatrist, rehab nursing staff, and therapy     team. 3. The patient's level of medical complexity and substantial therapy     needs in context of that medical necessity cannot be provided a     lesser intensity of care.  Medical complexity includes the     patient's hypotension and bradycardia, which at least partly cord     mediated as well as neurogenic bowel and bladder, hyperlipidemia,     etc. 4. The patient has experienced substantial functional loss from  her     baseline.  Upon most recent functional assessment, she is mod     assist bed mobility, plus 2 total assist transfers 60%, plus 2     total assist gait to feet, and total assist with ADLs.     Premorbidly, she was independent and driving.  Judging by the     patient's diagnosis, physical exam, and functional history, she has     a potential for functional progress, which will result in the     measurable gains while in inpatient rehab.  These gains will be of     substantial and practical use upon discharge to home facilitating     mobility and self-care. 5. The physiatrist will provide 24-hour management of medical needs as     well as oversight of therapy plan/treatment and provide guidance as     appropriate regarding interaction of the two.  Medical problem list     and plan are below. 6. The 24-hour rehab nursing team will assist in the management of the     patient's skin care needs as well as bowel and bladder function and     integration of therapy concepts and techniques. 7. PT will assess and treat for lower extremity strength, range of     motion, functional ability, gait, adaptive techniques, equipment     and safety goals modified independent. 8. OT will assess and treat for upper extremity use with ADLs,     adaptive techniques, equipment, neuromuscular education, upper     extremity strength with goals modified independent to occasional     min assist. 9. Case management and social worker will assess and treat for     psychosocial issues and discharge planning. 10.Team conference will be held weekly to assess progress towards     goals and to determine barriers at discharge. 11.The patient has demonstrated sufficient medical stability and     exercise capacity to tolerate at least 3 hours of therapy per day     at least 5 days week. 12.Estimated length of stay is 2-1/2 to 3 weeks.  Prognosis is good.     The patient is quite motivated.  MEDICAL PROBLEM LIST AND  PLAN: 1. DVT prophylaxis with thigh-high TED hose.  Also add subcu Lovenox     until mobility increases adequately.  Follow for any signs of     bleeding, platelets, etc. 2. Pain management p.r.n. Robaxin and Percocet.  She seems to be under     reasonable control at present. She is having some intermittent pain in her right arm which may be neuropathic.  Consider low dose neurontin trial. 3. Hypotension and bradycardia.  The patient has been weaned from     dopamine and hydrocortisone.  We will push adequate p.o. fluids.  Observe the patient's blood pressure and cardiac parameters when     she is up and moving out of the bed in therapy. 4. GERD:  Resume Nexium. 5. Neurogenic bladder:  We will check urinalysis and culture.  Check     PVRs and encourage time voiding     schedule. 6. Initiate bowel program to encourage regular daily bowel movements.     We will begin scheduled Senokot-S tonight with suppository in the     morning tomorrow.     Ranelle Oyster, M.D.     ZTS/MEDQ  D:  03/20/2011  T:  03/21/2011  Job:  [id number]  cc:   Dossie Der, MD Cristi Loron, M.D.  Electronically Signed by Faith Rogue M.D. on 04/10/2011 09:34:04 AM

## 2011-04-10 NOTE — Discharge Summary (Signed)
[name]              ACCOUNT NO.:  [id number]  MEDICAL RECORD NO.:  [id number]  LOCATION:  4009                         FACILITY:  [facility]  PHYSICIAN:  Ranelle Oyster, M.D.DATE OF BIRTH:  [birth year]  DATE OF ADMISSION:  03/20/2011 DATE OF DISCHARGE:  04/03/2011                              DISCHARGE SUMMARY   DISCHARGE DIAGNOSES: 1. Cervical C5-C6 fracture subluxation with central cord injury, pain     management. 2. Subcutaneous Lovenox for deep vein thrombosis prophylaxis. 3. Hypotension with bradycardia. 4. Gastroesophageal reflux disease. 5. Hyperlipidemia.  This is a 77 year old white female admitted on Mar 13, 2011, after a fall out of bed without loss of consciousness.  Cranial CT scan negative.  Sustained cervical C5-C6 fracture subluxation as well as spinal stenosis.  Noted increased upper extremity/lower extremity weakness.  Underwent open reduction and internal fixation of cervical C5- C6 with extensive anterior cervical diskectomy, decompression on Mar 13, 2011, per Dr. Lovell Sheehan.  Fitted with cervical collar.  Postoperative hypotension, bradycardia.  She was weaned from her dopamine drip. Cardiology Services consulted, conservative care provided.  Cardiac panel negative.  Required total assist for ambulation.  She was admitted for comprehensive rehab program.  PAST MEDICAL HISTORY:  See discharge diagnoses.  No alcohol or tobacco.  ALLERGIES:  CODEINE.  SOCIAL HISTORY:  Lives alone.  Local family works.  Granddaughter plans to assist as needed.  One-level home, 4 steps to entry.  FUNCTIONAL HISTORY:  Prior to admission was independent.  She does drive.  Functional status upon admission to rehab services was moderate assist bed mobility, +2 total assist transfers, +2 total assist ambulate 2 feet, total assist activities of daily living.  MEDICATIONS PRIOR TO ADMISSION:  Zetia, Lasix, Nexium, Claritin as needed, and albuterol inhaler as  needed.  PHYSICAL EXAMINATION:  VITAL SIGNS:  Blood pressure 153/65, pulse 51, temperature 97, respirations 20. GENERAL:  This was an alert female in no acute distress, oriented x3. EXTREMITIES:  Deep tendon reflexes 2+. NECK:  Cervical collar in place. NEUROLOGIC:  Sensation decreased in upper extremities to light touch. LUNGS:  Clear to auscultation. CARDIAC:  Regular rate and rhythm. ABDOMEN:  Soft, nontender.  Good bowel sounds.  REHABILITATION HOSPITAL COURSE:  The patient was admitted to inpatient rehab services with therapies initiated on a 3-hour daily basis consisting of physical therapy, occupational therapy, and 24-hour rehabilitation nursing.  The following issues were addressed during the patient's rehabilitation stay.  Pertaining to Ms. Inch's cervical C5-C6 fracture subluxation, she had undergone anterior cervical diskectomy and decompression on Mar 13, 2011, per Dr. Tressie Stalker.  Cervical collar in place.  She had been placed on Neurontin for pain control as well as oxycodone with good results.  Subcutaneous Lovenox had been initiated for deep vein thrombosis prophylaxis.  She had initial hypotension, bradycardia during her acute hospital stay.  This had since resolved. Blood pressure is well controlled.  No orthostatic changes noted.  She would follow up with her primary MD.  She remained on Zetia for history of hyperlipidemia.  She had no bowel or bladder disturbances other than some mild constipation that was resolved with laxative assistance.  The patient received weekly collaborative interdisciplinary team  conferences to discuss estimated length of stay, family teaching, and any barriers to discharge.  She was minimal assist to supervision for ambulation and transfers, minimal assist for stairs, needing assistance for lower body activities of daily living as well as upper body moderate assist.  Her granddaughter would provide the necessary assistance at home.   Full family teaching was completed and plan is to be discharged to home.  Latest labs showed a sodium 143, BUN 20, creatinine 0.6.  She had initial hypokalemia of 2.8, this was supplemented, latest potassium levels of 4.0.  She continued to be monitored.  Discharge medications at the time of dictation included: 1. Neurontin 300 mg every 8 hours. 2. Zetia 10 mg daily. 3. Nexium 40 mg daily. 4. Potassium 20 mEq daily. 5. FiberCon 1 tablet twice daily. 6. Robaxin 500 mg every 6 hours as needed for spasms. 7. Percocet 5/325 one to two tablets every 6 hours as needed for pain.  DIET:  Regular.  SPECIAL INSTRUCTIONS:  Cervical collar at all times.  The patient should follow up with Dr. Tressie Stalker, [phone], Neurosurgery, call for appointment 2 weeks; Dr. Faith Rogue at the outpatient rehab service office as needed; Dr. Juluis Rainier, medical management to follow up potassium levels over the next couple of weeks.  The patient was advised no driving at this time.     [name], P.A.   ______________________________ Ranelle Oyster, M.D.    DA/MEDQ  D:  04/02/2011  T:  04/02/2011  Job:  [id number]  cc:   Dr. Juluis Rainier [name], M.D. [name], M.D.  Electronically Signed by [name] P.A. on 04/03/2011 03:53:53 PM Electronically Signed by Faith Rogue M.D. on 04/10/2011 09:34:08 AM

## 2011-04-20 DIAGNOSIS — Z0271 Encounter for disability determination: Secondary | ICD-10-CM

## 2011-04-26 NOTE — Discharge Summary (Signed)
  [name], [name]              ACCOUNT NO.:  [id number]  MEDICAL RECORD NO.:  [id number]  LOCATION:  3010                         FACILITY:  [facility]  PHYSICIAN:  Cristi Loron, M.D.DATE OF BIRTH:  1918-08-06  DATE OF ADMISSION:  03/13/2011 DATE OF DISCHARGE:  03/20/2011                              DISCHARGE SUMMARY   BRIEF HISTORY:  The patient is a 77 year old white female who fell out of bed suffering C5-6 fracture, subluxation with spinal cord injury.  I discussed various treatment with the patient and her son including surgery.  The patient has weighed the risks, benefits, and alternatives of surgery and decided to proceed with a C5-6 anterior cervical diskectomy, fusion, plating.  For further details of this admission, please refer to typed history and physical.  HOSPITAL COURSE:  I admitted the patient to [facility] on Mar 13, 2011.  On the day of admission, I performed a C5-6 anterior cervical diskectomy, fusion, plating.  The surgery went well and for full details of this operation please refer to typed operative note.  POSTOPERATIVE COURSE:  The patient's postoperative course was as follows.  She initially was monitored in the ICU.  She was transferred to the neurosurgical floor.  We had Physical Therapy and PMR see the patient.  The patient was transferred to inpatient rehabilitation on March 20, 2011.  FINAL DIAGNOSES:  C5-6 fracture and subluxation, spinal cord injury, spinal stenosis, cervicalgia, cervical radiculopathy/myelopathy.  PROCEDURE PERFORMED:  Open reduction and internal fixation, C5-6 fracture subluxation; C5-6 extensive anterior cervical diskectomy/decompression; C5-6 anterior interbody arthrodesis with local morselized autograft bone and Actifuse bone graft extender; insertion of C5-6 interbody prosthesis (PEEK interbody prosthesis); C5-6 anterior cervical plating with globus titanium plate and screws.     Cristi Loron,  M.D.     JDJ/MEDQ  D:  04/25/2011  T:  04/26/2011  Job:  [id number]  Electronically Signed by Tressie Stalker M.D. on 04/26/2011 07:38:33 AM

## 2011-08-06 ENCOUNTER — Ambulatory Visit: Payer: Medicare Other | Admitting: Cardiology

## 2011-10-17 ENCOUNTER — Encounter: Payer: Self-pay | Admitting: Cardiology

## 2011-10-18 ENCOUNTER — Encounter: Payer: Self-pay | Admitting: Cardiology

## 2011-10-18 ENCOUNTER — Ambulatory Visit (INDEPENDENT_AMBULATORY_CARE_PROVIDER_SITE_OTHER): Payer: Medicare Other | Admitting: Cardiology

## 2011-10-18 VITALS — BP 112/58 | HR 90 | Ht 63.0 in | Wt 143.4 lb

## 2011-10-18 DIAGNOSIS — I350 Nonrheumatic aortic (valve) stenosis: Secondary | ICD-10-CM | POA: Insufficient documentation

## 2011-10-18 DIAGNOSIS — I10 Essential (primary) hypertension: Secondary | ICD-10-CM | POA: Insufficient documentation

## 2011-10-18 DIAGNOSIS — I359 Nonrheumatic aortic valve disorder, unspecified: Secondary | ICD-10-CM

## 2011-10-18 NOTE — Assessment & Plan Note (Signed)
She is mild to moderate aortic stenosis by echocardiogram in 2010. She is still asymptomatic. I think at her age it is very unlikely that this will progress to the point where she will be symptomatic. No further therapy needed at this time. I'll see her back on an as-needed basis.

## 2011-10-18 NOTE — Progress Notes (Signed)
   Sila E Yano Date of Birth: [birth year] Medical Record #[id number]  History of Present Illness: Mrs. Parrott is seen for yearly followup. She has a history of mild to moderate aortic stenosis. She also has a history of hypertension and dyslipidemia. She reports that this past May she fell out of bed and fractured her neck. She was hospitalized and underwent extensive cervical surgery. During that hospital stay her antihypertensive therapy was discontinued. Her blood pressure as remained under excellent control. She denies any symptoms of chest pain, shortness of breath, dizziness, or palpitations.  No current outpatient prescriptions on file prior to visit.    Allergies  Allergen Reactions  . Codeine     Past Medical History  Diagnosis Date  . GERD (gastroesophageal reflux disease)   . Hyperlipidemia   . Moderate aortic stenosis   . HTN (hypertension)     Past Surgical History  Procedure Date  . Appendectomy   . Cataract surgery   . Cholecystectomy   . Anterior cervical decomp/discectomy fusion     History  Smoking status  . Never Smoker   Smokeless tobacco  . Not on file    History  Alcohol Use No    Family History  Problem Relation Age of Onset  . Coronary artery disease      Postive in family    Review of Systems: The review of systems is positive for persistent numbness in her hand.  All other systems were reviewed and are negative.  Physical Exam: BP 112/58  Pulse 90  Ht 5\' 3"  (1.6 m)  Wt 143 lb 6.4 oz (65.046 kg)  BMI 25.40 kg/m2 She is an elderly white female in no acute distress. Her HEENT exam is unremarkable. She has no JVD or bruits. Lungs are clear. Cardiac exam reveals a regular rate and rhythm with a grade 2/6 systolic ejection murmur the right upper sternal border radiating to the right carotid. There is no diastolic murmur or gallop. Abdomen is soft and nontender. She has no edema. Pedal pulses are good. LABORATORY DATA: ECG demonstrates  normal sinus rhythm with left axis deviation. It is otherwise normal.  Assessment / Plan:

## 2011-10-18 NOTE — Patient Instructions (Signed)
Continue your current therapy  I will see you as needed. 

## 2011-10-18 NOTE — Assessment & Plan Note (Signed)
Hypertension has resolved. She is on no therapy and blood pressure is excellent.

## 2012-07-22 ENCOUNTER — Encounter: Payer: Self-pay | Admitting: *Deleted

## 2012-08-25 IMAGING — CR DG CERVICAL SPINE 2 OR 3 VIEWS
1 series · 1 of 1 positions shown · non-contrast
Comparison: CT 03/13/2011

CLINICAL DATA: C5-6 decompression.

CERVICAL SPINE - 2-3 VIEW

[view not recorded]
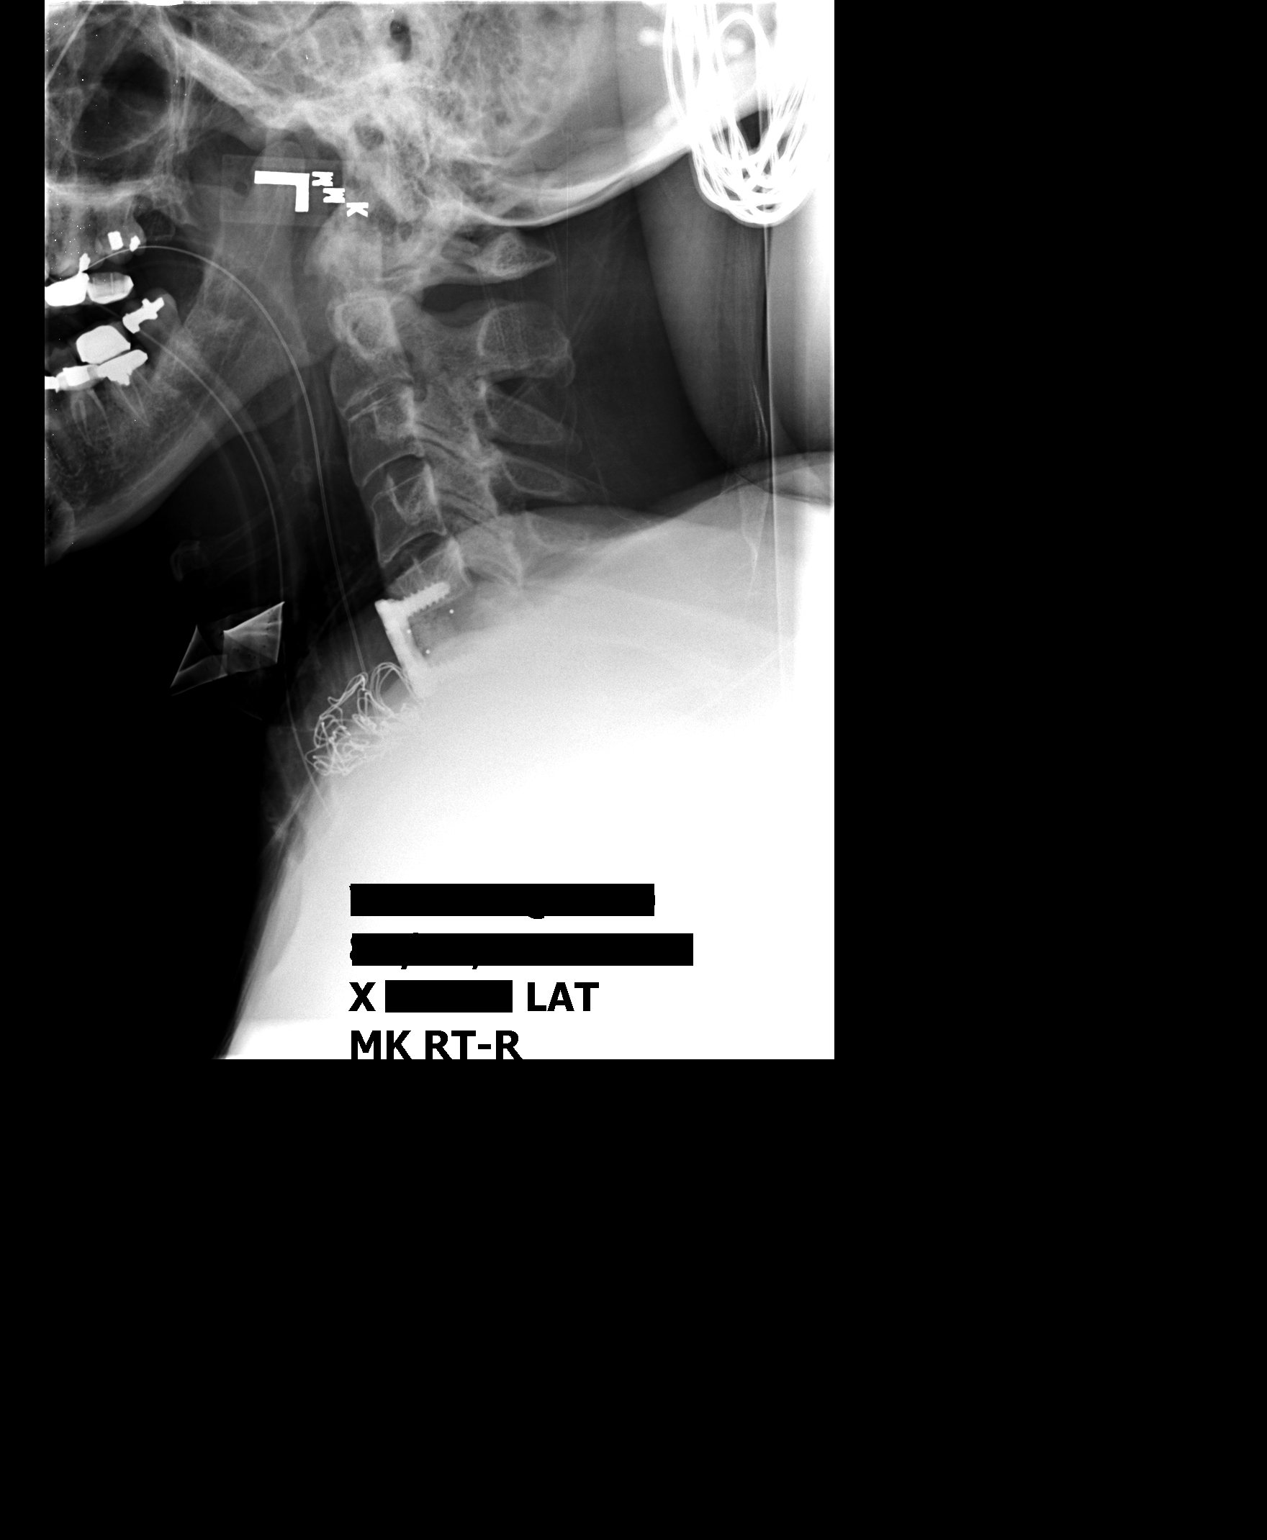

[1 of 1 positions shown; findings below may reference images not displayed]

FINDINGS: First lateral intraoperative image demonstrates anterior
localizing instruments at the C5-6 level.

Second lateral intraoperative image demonstrates changes of
anterior cervical discectomy and fusion at C5-6.  The C6 vertebral
body is difficult to visualize due to overlying shoulders.  There
appears to be normal alignment.
IMPRESSION: Postoperative changes as C5-6 from ACDF as above.

## 2012-08-25 IMAGING — CR DG WRIST COMPLETE 3+V*L*
4 series · 4 of 4 positions shown · non-contrast
Comparison: None

CLINICAL DATA: Fall, pain.

LEFT WRIST - COMPLETE 3+ VIEW

[x wrist pa left]
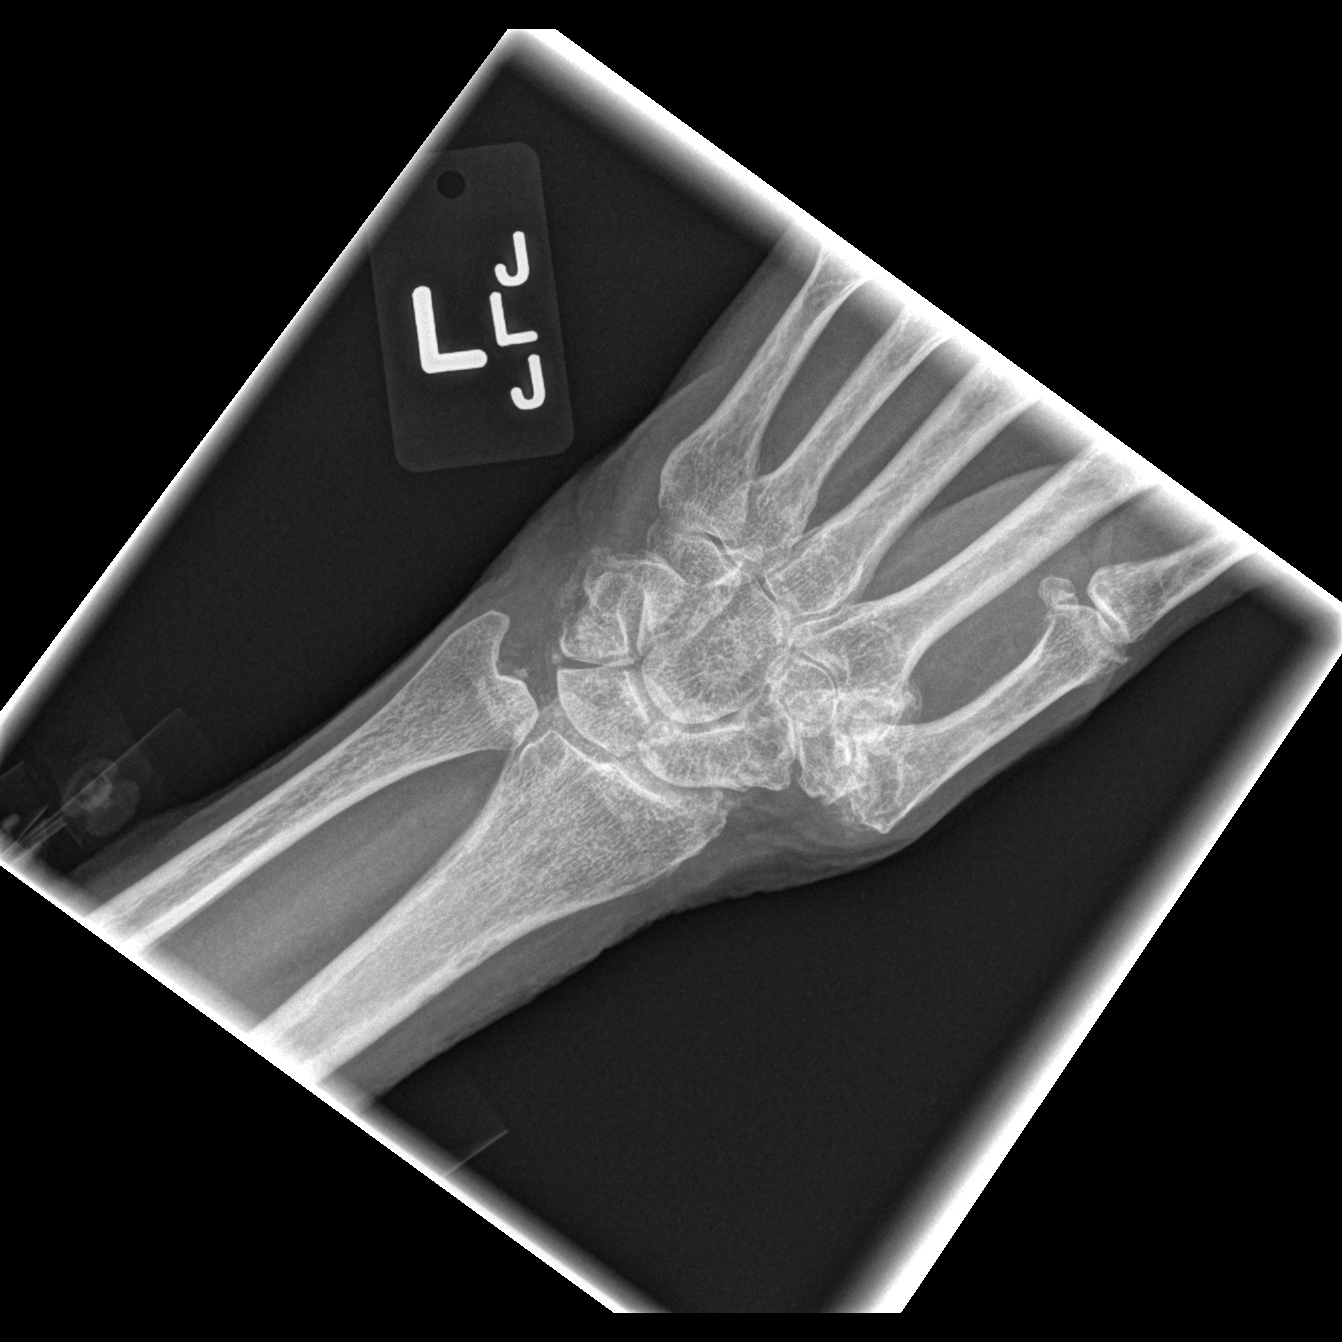

[x wrist obl left]
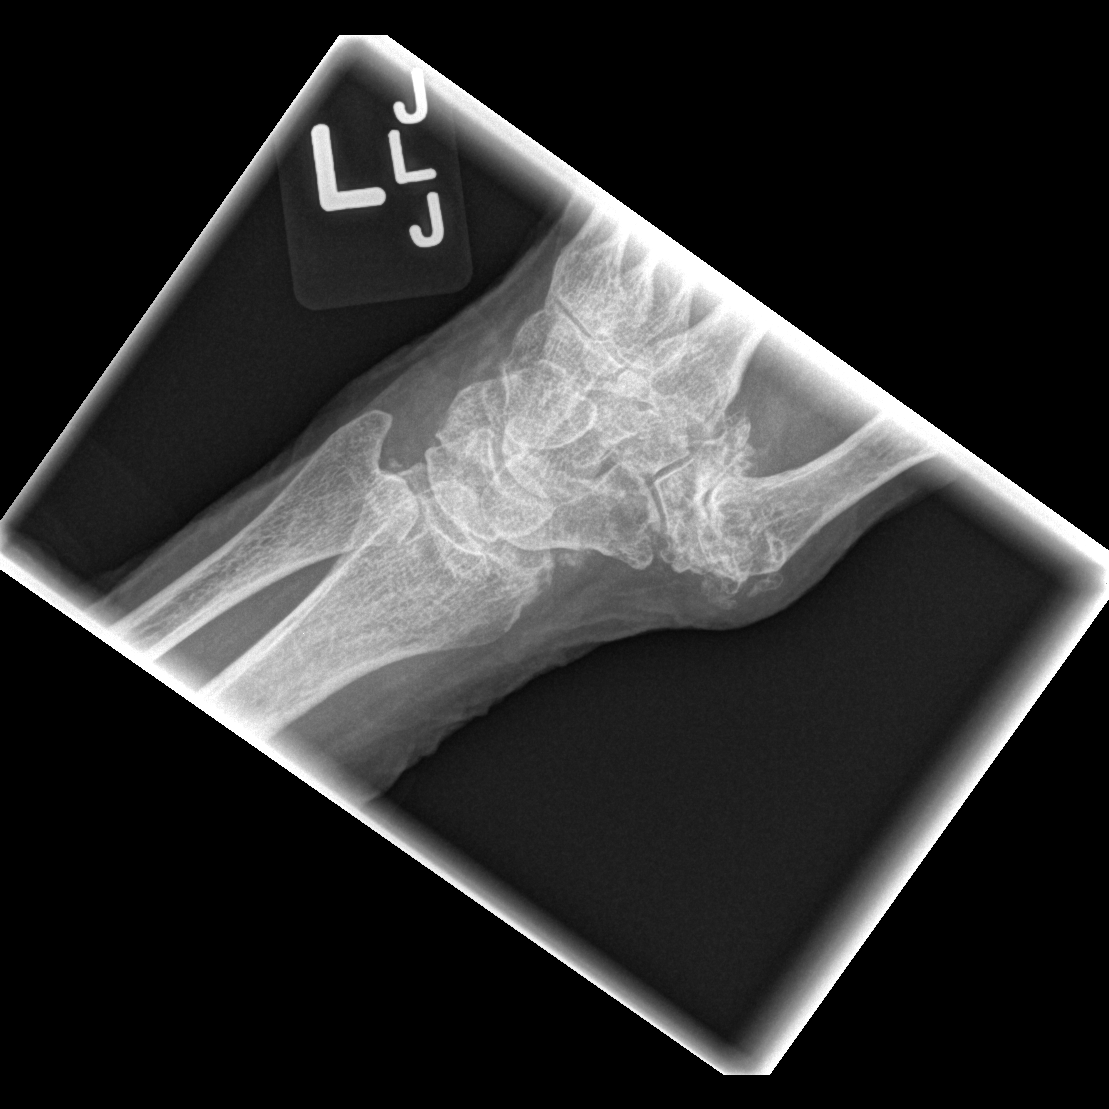

[x wrist lat left]
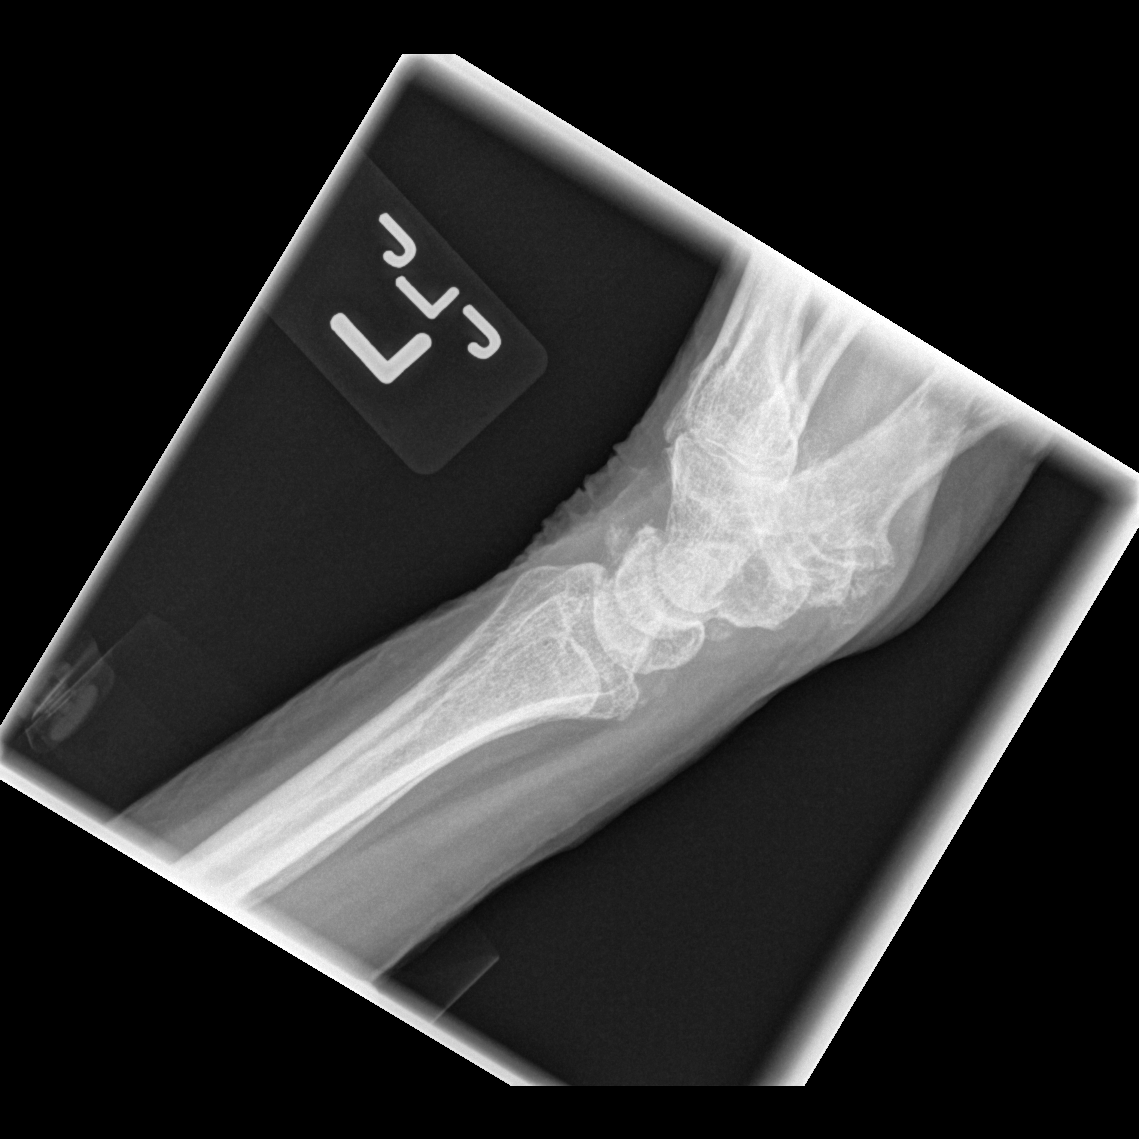

[x wrist navicular]
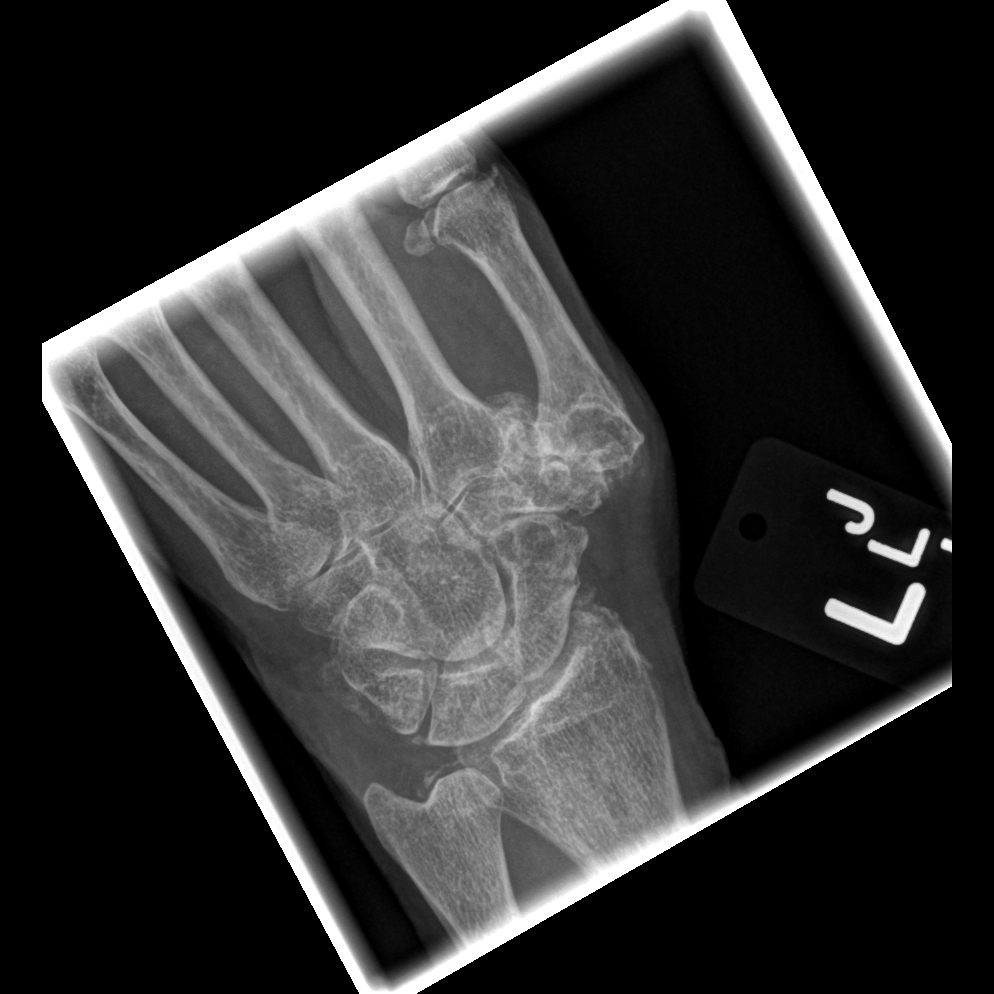

[4 of 4 positions shown; findings below may reference images not displayed]

FINDINGS: Advanced degenerative joint disease in the first carpal
metacarpal joint and wrist joint.  There is chondrocalcinosis.  No
acute bony abnormality.  No fracture, subluxation or dislocation.
IMPRESSION: Advanced degenerative changes.  No definite acute process.

## 2012-08-25 IMAGING — CR DG SHOULDER 2+V*L*
3 series · 3 of 3 positions shown · non-contrast
Comparison: None.

CLINICAL DATA: Fall.  Pain.

LEFT SHOULDER - 2+ VIEW

[view not recorded (1 of 3)]
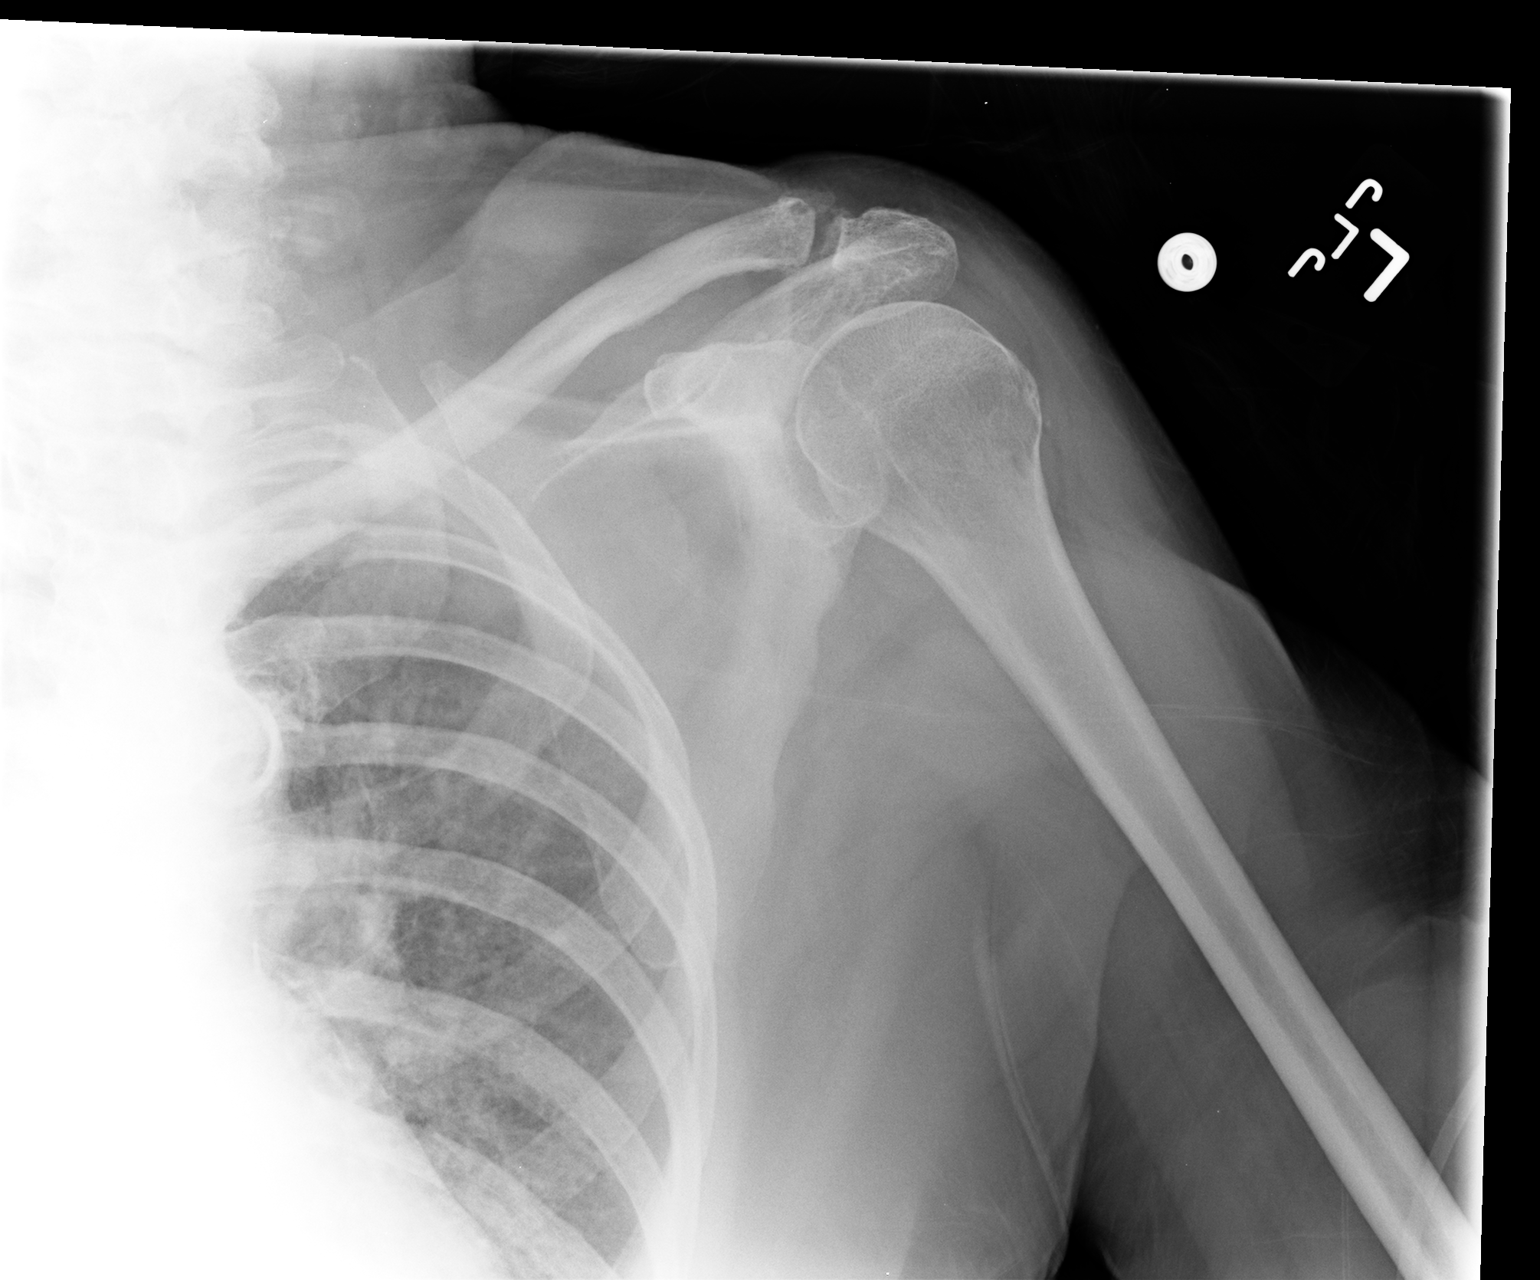

[view not recorded (2 of 3)]
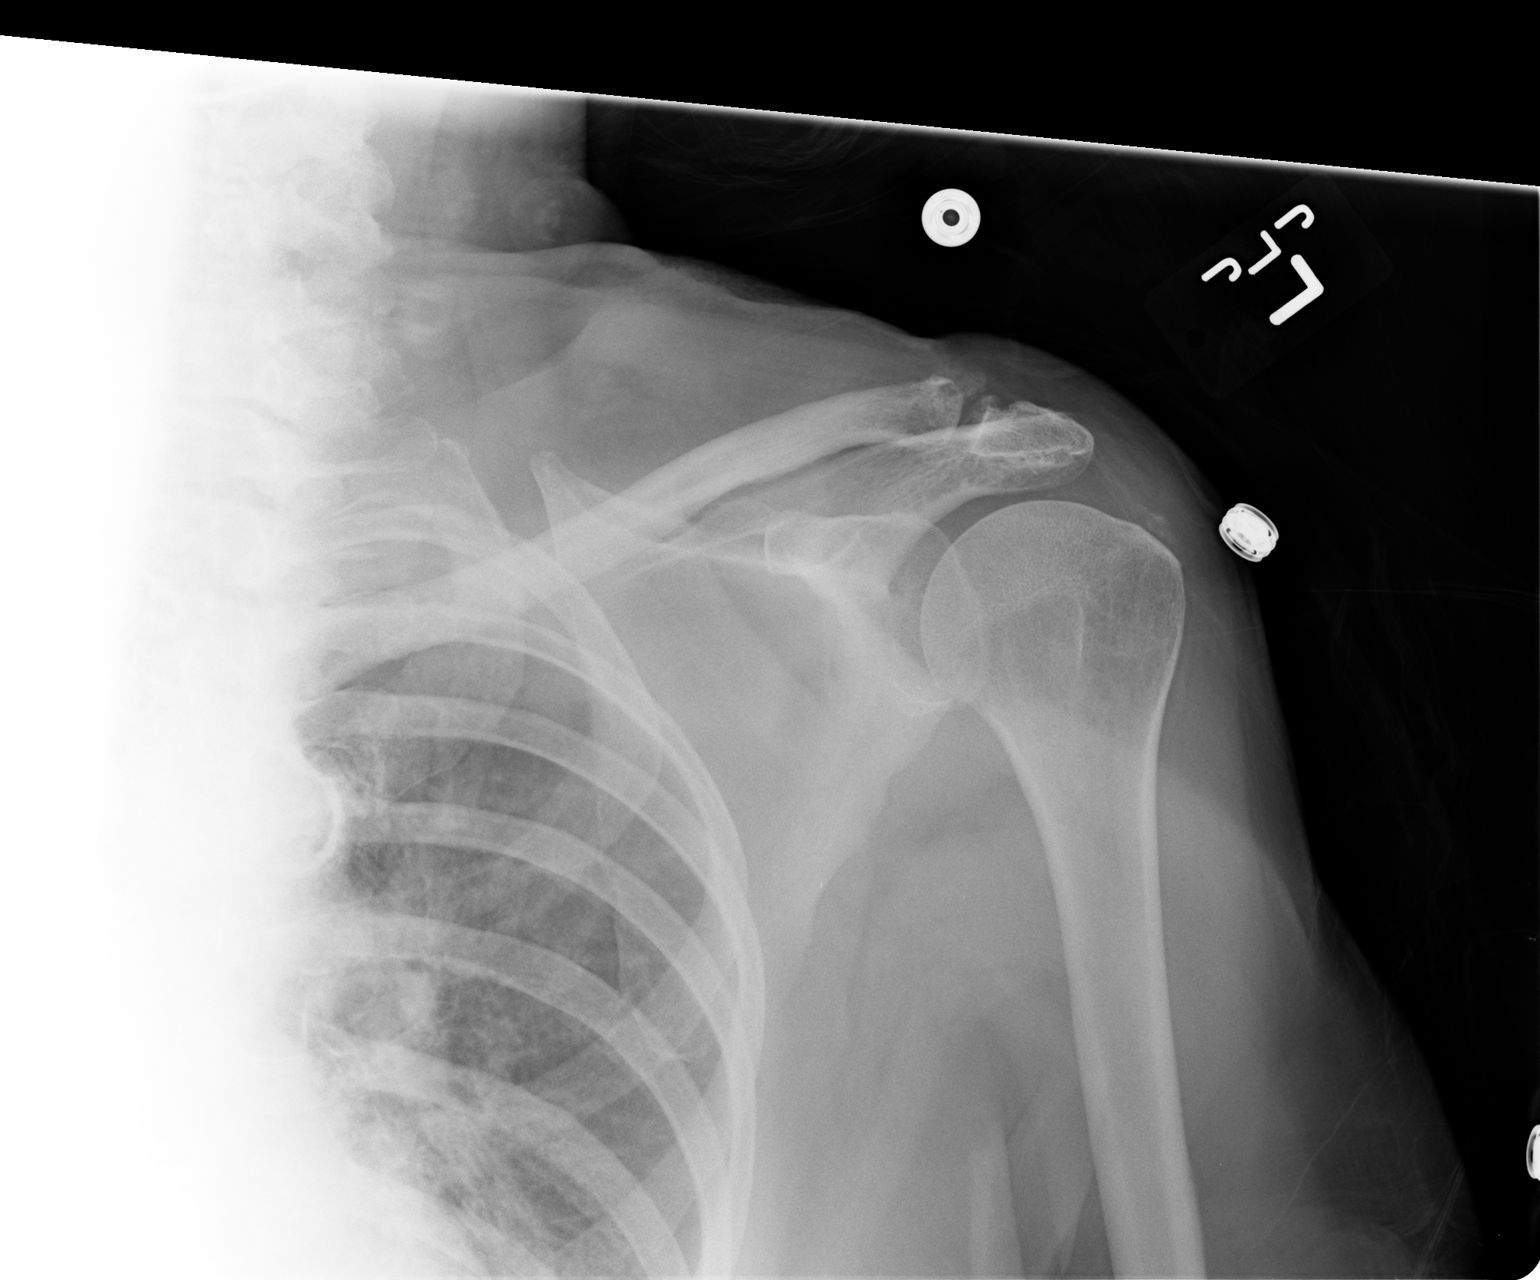

[view not recorded (3 of 3)]
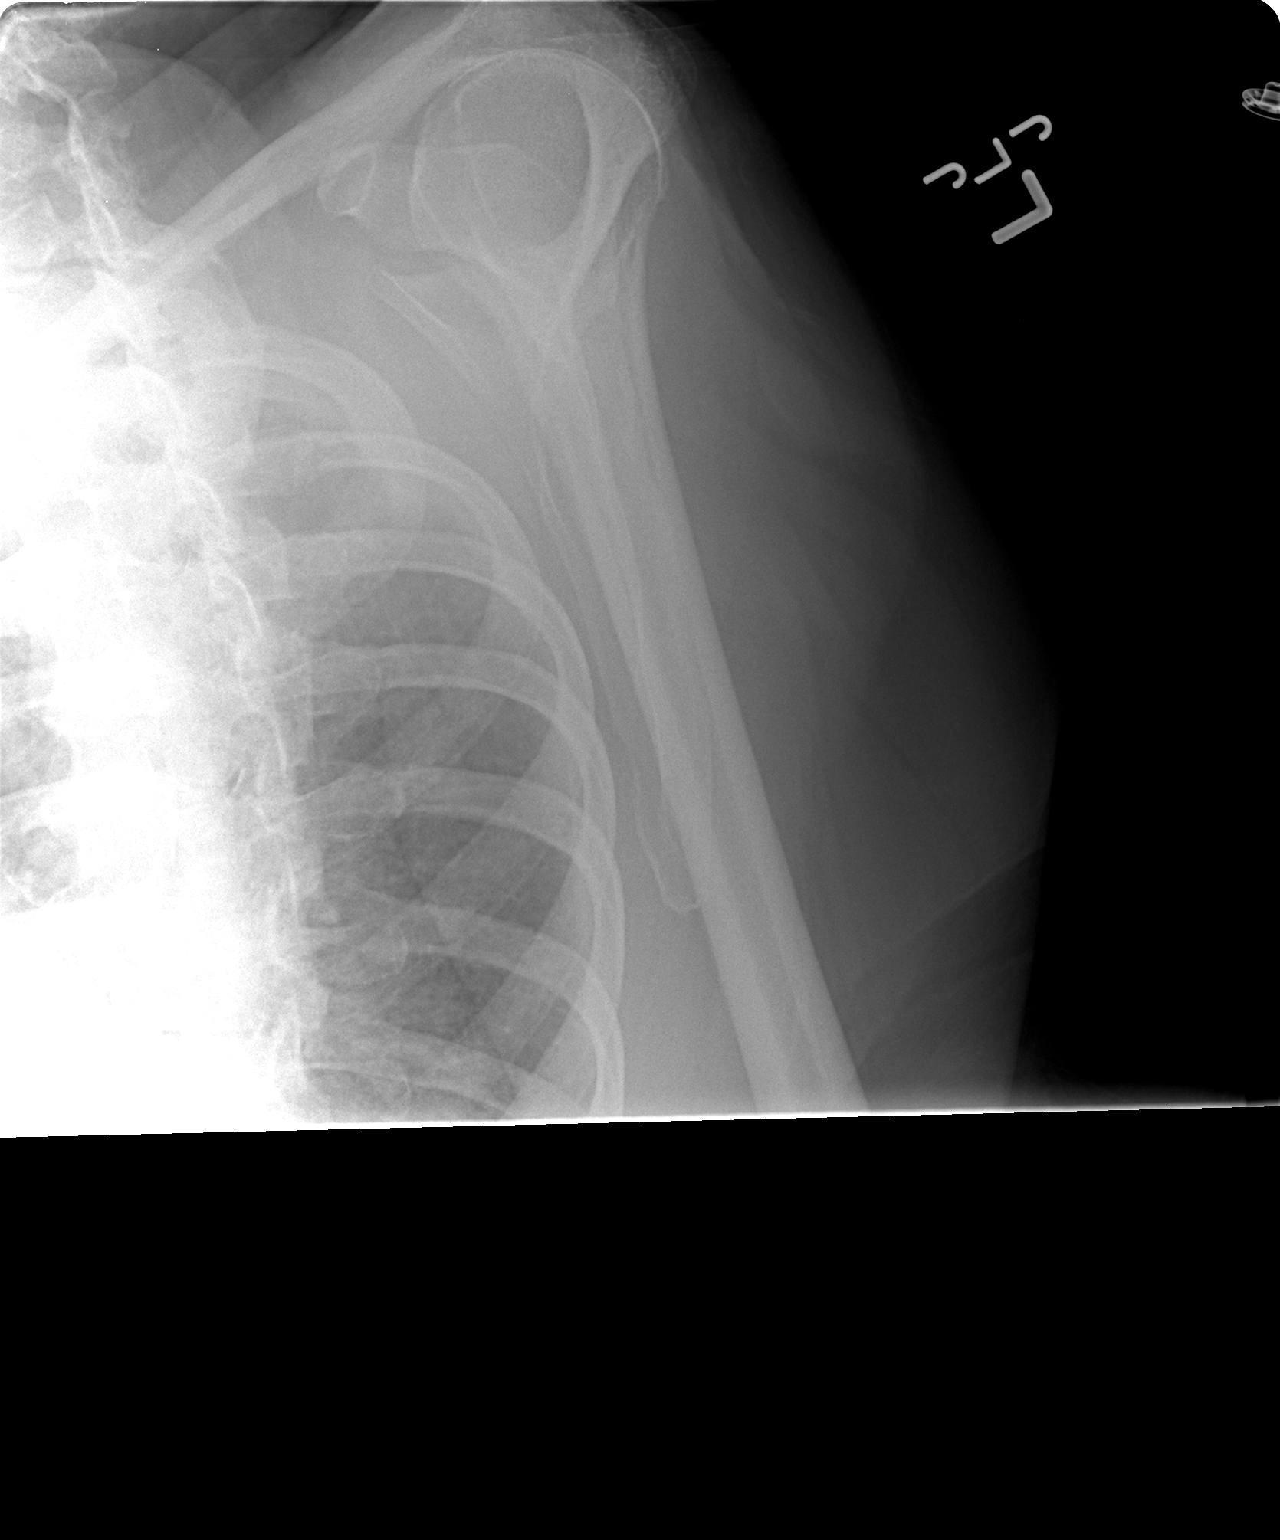

[3 of 3 positions shown; findings below may reference images not displayed]

FINDINGS: No fracture or dislocation.  Acromioclavicular joint
degenerative changes.
IMPRESSION: No fracture or dislocation.

## 2014-07-21 ENCOUNTER — Inpatient Hospital Stay (HOSPITAL_COMMUNITY)
Admission: EM | Admit: 2014-07-21 | Discharge: 2014-07-26 | DRG: 981 | Disposition: A | Discharge status: Home Health | Payer: Medicare Other | Attending: Internal Medicine | Admitting: Internal Medicine

## 2014-07-21 ENCOUNTER — Encounter (HOSPITAL_COMMUNITY)
Admission: EM | Disposition: A | Discharge status: Home Health | Payer: Self-pay | Source: Home / Self Care | Attending: Internal Medicine

## 2014-07-21 ENCOUNTER — Emergency Department (HOSPITAL_COMMUNITY): Payer: Medicare Other

## 2014-07-21 ENCOUNTER — Encounter (HOSPITAL_COMMUNITY): Payer: Self-pay | Admitting: Emergency Medicine

## 2014-07-21 ENCOUNTER — Other Ambulatory Visit: Payer: Self-pay | Admitting: Gastroenterology

## 2014-07-21 DIAGNOSIS — I440 Atrioventricular block, first degree: Secondary | ICD-10-CM | POA: Diagnosis present

## 2014-07-21 DIAGNOSIS — R110 Nausea: Secondary | ICD-10-CM

## 2014-07-21 DIAGNOSIS — M722 Plantar fascial fibromatosis: Secondary | ICD-10-CM | POA: Diagnosis present

## 2014-07-21 DIAGNOSIS — M19071 Primary osteoarthritis, right ankle and foot: Secondary | ICD-10-CM | POA: Diagnosis present

## 2014-07-21 DIAGNOSIS — R0600 Dyspnea, unspecified: Secondary | ICD-10-CM

## 2014-07-21 DIAGNOSIS — J9601 Acute respiratory failure with hypoxia: Secondary | ICD-10-CM | POA: Diagnosis present

## 2014-07-21 DIAGNOSIS — Z981 Arthrodesis status: Secondary | ICD-10-CM

## 2014-07-21 DIAGNOSIS — Z8249 Family history of ischemic heart disease and other diseases of the circulatory system: Secondary | ICD-10-CM | POA: Diagnosis not present

## 2014-07-21 DIAGNOSIS — K562 Volvulus: Secondary | ICD-10-CM | POA: Diagnosis present

## 2014-07-21 DIAGNOSIS — Z66 Do not resuscitate: Secondary | ICD-10-CM | POA: Diagnosis present

## 2014-07-21 DIAGNOSIS — Z885 Allergy status to narcotic agent status: Secondary | ICD-10-CM | POA: Diagnosis not present

## 2014-07-21 DIAGNOSIS — I350 Nonrheumatic aortic (valve) stenosis: Secondary | ICD-10-CM

## 2014-07-21 DIAGNOSIS — I959 Hypotension, unspecified: Secondary | ICD-10-CM | POA: Diagnosis present

## 2014-07-21 DIAGNOSIS — K5900 Constipation, unspecified: Secondary | ICD-10-CM | POA: Diagnosis present

## 2014-07-21 DIAGNOSIS — K219 Gastro-esophageal reflux disease without esophagitis: Secondary | ICD-10-CM | POA: Diagnosis present

## 2014-07-21 DIAGNOSIS — K3189 Other diseases of stomach and duodenum: Secondary | ICD-10-CM | POA: Diagnosis present

## 2014-07-21 DIAGNOSIS — D72829 Elevated white blood cell count, unspecified: Secondary | ICD-10-CM | POA: Diagnosis present

## 2014-07-21 DIAGNOSIS — J040 Acute laryngitis: Secondary | ICD-10-CM | POA: Diagnosis present

## 2014-07-21 DIAGNOSIS — K920 Hematemesis: Secondary | ICD-10-CM | POA: Diagnosis present

## 2014-07-21 DIAGNOSIS — K922 Gastrointestinal hemorrhage, unspecified: Secondary | ICD-10-CM

## 2014-07-21 DIAGNOSIS — M79671 Pain in right foot: Secondary | ICD-10-CM

## 2014-07-21 DIAGNOSIS — M79672 Pain in left foot: Secondary | ICD-10-CM

## 2014-07-21 DIAGNOSIS — J690 Pneumonitis due to inhalation of food and vomit: Secondary | ICD-10-CM | POA: Diagnosis not present

## 2014-07-21 DIAGNOSIS — E785 Hyperlipidemia, unspecified: Secondary | ICD-10-CM | POA: Diagnosis present

## 2014-07-21 DIAGNOSIS — K449 Diaphragmatic hernia without obstruction or gangrene: Secondary | ICD-10-CM | POA: Diagnosis present

## 2014-07-21 DIAGNOSIS — Z87891 Personal history of nicotine dependence: Secondary | ICD-10-CM | POA: Diagnosis not present

## 2014-07-21 DIAGNOSIS — E46 Unspecified protein-calorie malnutrition: Secondary | ICD-10-CM | POA: Diagnosis present

## 2014-07-21 DIAGNOSIS — Z6827 Body mass index (BMI) 27.0-27.9, adult: Secondary | ICD-10-CM

## 2014-07-21 DIAGNOSIS — E8770 Fluid overload, unspecified: Secondary | ICD-10-CM | POA: Diagnosis present

## 2014-07-21 DIAGNOSIS — R52 Pain, unspecified: Secondary | ICD-10-CM

## 2014-07-21 DIAGNOSIS — E876 Hypokalemia: Secondary | ICD-10-CM | POA: Diagnosis not present

## 2014-07-21 DIAGNOSIS — J9600 Acute respiratory failure, unspecified whether with hypoxia or hypercapnia: Secondary | ICD-10-CM | POA: Diagnosis not present

## 2014-07-21 DIAGNOSIS — I10 Essential (primary) hypertension: Secondary | ICD-10-CM

## 2014-07-21 DIAGNOSIS — T501X5A Adverse effect of loop [high-ceiling] diuretics, initial encounter: Secondary | ICD-10-CM | POA: Diagnosis not present

## 2014-07-21 DIAGNOSIS — R109 Unspecified abdominal pain: Secondary | ICD-10-CM | POA: Diagnosis present

## 2014-07-21 DIAGNOSIS — E872 Acidosis: Secondary | ICD-10-CM | POA: Diagnosis not present

## 2014-07-21 HISTORY — PX: ESOPHAGOGASTRODUODENOSCOPY: SHX5428

## 2014-07-21 HISTORY — DX: Cardiac murmur, unspecified: R01.1

## 2014-07-21 HISTORY — DX: Ventral hernia without obstruction or gangrene: K43.9

## 2014-07-21 LAB — CBC WITH DIFFERENTIAL/PLATELET
Basophils Absolute: 0 10*3/uL (ref 0.0–0.1)
Basophils Relative: 0 % (ref 0–1)
EOS PCT: 0 % (ref 0–5)
Eosinophils Absolute: 0 10*3/uL (ref 0.0–0.7)
HEMATOCRIT: 42.9 % (ref 36.0–46.0)
HEMOGLOBIN: 14.5 g/dL (ref 12.0–15.0)
LYMPHS PCT: 8 % — AB (ref 12–46)
Lymphs Abs: 1.1 10*3/uL (ref 0.7–4.0)
MCH: 30.8 pg (ref 26.0–34.0)
MCHC: 33.8 g/dL (ref 30.0–36.0)
MCV: 91.1 fL (ref 78.0–100.0)
MONO ABS: 0.3 10*3/uL (ref 0.1–1.0)
MONOS PCT: 3 % (ref 3–12)
Neutro Abs: 11.5 10*3/uL — ABNORMAL HIGH (ref 1.7–7.7)
Neutrophils Relative %: 89 % — ABNORMAL HIGH (ref 43–77)
Platelets: 258 10*3/uL (ref 150–400)
RBC: 4.71 MIL/uL (ref 3.87–5.11)
RDW: 13.9 % (ref 11.5–15.5)
WBC: 12.9 10*3/uL — ABNORMAL HIGH (ref 4.0–10.5)

## 2014-07-21 LAB — POCT GASTRIC OCCULT BLOOD (1-CARD TO LAB): OCCULT BLOOD, GASTRIC: POSITIVE — AB

## 2014-07-21 LAB — TYPE AND SCREEN
ABO/RH(D): O POS
Antibody Screen: NEGATIVE

## 2014-07-21 LAB — COMPREHENSIVE METABOLIC PANEL
ALT: 14 U/L (ref 0–35)
ANION GAP: 17 — AB (ref 5–15)
AST: 18 U/L (ref 0–37)
Albumin: 4.1 g/dL (ref 3.5–5.2)
Alkaline Phosphatase: 76 U/L (ref 39–117)
BUN: 15 mg/dL (ref 6–23)
CHLORIDE: 98 meq/L (ref 96–112)
CO2: 26 meq/L (ref 19–32)
CREATININE: 0.73 mg/dL (ref 0.50–1.10)
Calcium: 9.7 mg/dL (ref 8.4–10.5)
GFR calc Af Amer: 82 mL/min — ABNORMAL LOW (ref >90)
GFR, EST NON AFRICAN AMERICAN: 71 mL/min — AB (ref >90)
Glucose, Bld: 172 mg/dL — ABNORMAL HIGH (ref 70–99)
Potassium: 4.1 mEq/L (ref 3.7–5.3)
Sodium: 141 mEq/L (ref 137–147)
Total Bilirubin: 0.3 mg/dL (ref 0.3–1.2)
Total Protein: 8.3 g/dL (ref 6.0–8.3)

## 2014-07-21 LAB — MRSA PCR SCREENING: MRSA by PCR: NEGATIVE

## 2014-07-21 LAB — APTT: aPTT: 23 seconds — ABNORMAL LOW (ref 24–37)

## 2014-07-21 LAB — PROTIME-INR
INR: 0.95 (ref 0.00–1.49)
Prothrombin Time: 12.7 seconds (ref 11.6–15.2)

## 2014-07-21 LAB — CBC
HCT: 37.2 % (ref 36.0–46.0)
Hemoglobin: 12.5 g/dL (ref 12.0–15.0)
MCH: 30.9 pg (ref 26.0–34.0)
MCHC: 33.6 g/dL (ref 30.0–36.0)
MCV: 92.1 fL (ref 78.0–100.0)
Platelets: 246 10*3/uL (ref 150–400)
RBC: 4.04 MIL/uL (ref 3.87–5.11)
RDW: 14 % (ref 11.5–15.5)
WBC: 17 10*3/uL — ABNORMAL HIGH (ref 4.0–10.5)

## 2014-07-21 LAB — URINALYSIS, ROUTINE W REFLEX MICROSCOPIC
BILIRUBIN URINE: NEGATIVE
Glucose, UA: NEGATIVE mg/dL
Hgb urine dipstick: NEGATIVE
KETONES UR: 15 mg/dL — AB
NITRITE: NEGATIVE
PH: 8 (ref 5.0–8.0)
PROTEIN: NEGATIVE mg/dL
Specific Gravity, Urine: 1.021 (ref 1.005–1.030)
UROBILINOGEN UA: 0.2 mg/dL (ref 0.0–1.0)

## 2014-07-21 LAB — ABO/RH: ABO/RH(D): O POS

## 2014-07-21 LAB — URINE MICROSCOPIC-ADD ON

## 2014-07-21 LAB — TROPONIN I

## 2014-07-21 LAB — LIPASE, BLOOD: LIPASE: 18 U/L (ref 11–59)

## 2014-07-21 SURGERY — EGD (ESOPHAGOGASTRODUODENOSCOPY)
Anesthesia: Moderate Sedation

## 2014-07-21 MED ORDER — FENTANYL CITRATE 0.05 MG/ML IJ SOLN
50.0000 ug | Freq: Once | INTRAMUSCULAR | Status: AC
  Administered 2014-07-21: 50 ug via INTRAVENOUS
  Filled 2014-07-21: qty 2

## 2014-07-21 MED ORDER — ONDANSETRON 8 MG/NS 50 ML IVPB: 8.0000 mg | Freq: Four times a day (QID) | INTRAVENOUS | Status: DC | PRN

## 2014-07-21 MED ORDER — ONDANSETRON HCL 4 MG/2ML IJ SOLN
4.0000 mg | Freq: Once | INTRAMUSCULAR | Status: AC
  Administered 2014-07-21: 4 mg via INTRAVENOUS

## 2014-07-21 MED ORDER — BUTAMBEN-TETRACAINE-BENZOCAINE 2-2-14 % EX AERO
INHALATION_SPRAY | CUTANEOUS | Status: DC | PRN
  Administered 2014-07-21: 2 via TOPICAL

## 2014-07-21 MED ORDER — FENTANYL CITRATE 0.05 MG/ML IJ SOLN
INTRAMUSCULAR | Status: AC
  Filled 2014-07-21: qty 2

## 2014-07-21 MED ORDER — FENTANYL CITRATE 0.05 MG/ML IJ SOLN
INTRAMUSCULAR | Status: DC | PRN
  Administered 2014-07-21: 12.5 ug via INTRAVENOUS

## 2014-07-21 MED ORDER — SODIUM CHLORIDE 0.9 % IJ SOLN
3.0000 mL | Freq: Two times a day (BID) | INTRAMUSCULAR | Status: DC
  Administered 2014-07-23 – 2014-07-25 (×5): 3 mL via INTRAVENOUS

## 2014-07-21 MED ORDER — ONDANSETRON HCL 4 MG/2ML IJ SOLN
INTRAMUSCULAR | Status: AC
  Filled 2014-07-21: qty 2

## 2014-07-21 MED ORDER — ONDANSETRON HCL 4 MG PO TABS
4.0000 mg | ORAL_TABLET | Freq: Four times a day (QID) | ORAL | Status: DC | PRN
  Filled 2014-07-21: qty 1

## 2014-07-21 MED ORDER — SODIUM CHLORIDE 0.9 % IV SOLN
INTRAVENOUS | Status: DC
  Administered 2014-07-21 – 2014-07-22 (×2): via INTRAVENOUS

## 2014-07-21 MED ORDER — PANTOPRAZOLE SODIUM 40 MG IV SOLR
40.0000 mg | Freq: Once | INTRAVENOUS | Status: AC
  Administered 2014-07-21: 40 mg via INTRAVENOUS
  Filled 2014-07-21: qty 40

## 2014-07-21 MED ORDER — MIDAZOLAM HCL 5 MG/ML IJ SOLN
INTRAMUSCULAR | Status: AC
  Filled 2014-07-21: qty 1

## 2014-07-21 MED ORDER — ONDANSETRON HCL 4 MG/2ML IJ SOLN
4.0000 mg | Freq: Once | INTRAMUSCULAR | Status: AC
  Administered 2014-07-21: 4 mg via INTRAVENOUS
  Filled 2014-07-21: qty 2

## 2014-07-21 MED ORDER — SODIUM CHLORIDE 0.9 % IV BOLUS (SEPSIS)
1000.0000 mL | Freq: Once | INTRAVENOUS | Status: AC
  Administered 2014-07-21: 1000 mL via INTRAVENOUS

## 2014-07-21 MED ORDER — DIPHENHYDRAMINE HCL 50 MG/ML IJ SOLN
INTRAMUSCULAR | Status: AC
  Filled 2014-07-21: qty 1

## 2014-07-21 MED ORDER — DEXTROSE 5 % IV SOLN
1.0000 g | Freq: Once | INTRAVENOUS | Status: AC
  Administered 2014-07-21: 1 g via INTRAVENOUS
  Filled 2014-07-21: qty 10

## 2014-07-21 MED ORDER — MIDAZOLAM HCL 10 MG/2ML IJ SOLN
INTRAMUSCULAR | Status: DC | PRN
  Administered 2014-07-21 (×2): 1 mg via INTRAVENOUS

## 2014-07-21 MED ORDER — SODIUM CHLORIDE 0.9 % IV SOLN
8.0000 mg/h | INTRAVENOUS | Status: DC
  Administered 2014-07-21 – 2014-07-23 (×3): 8 mg/h via INTRAVENOUS
  Filled 2014-07-21 (×11): qty 80

## 2014-07-21 MED ORDER — PANTOPRAZOLE SODIUM 40 MG IV SOLR: 40.0000 mg | Freq: Two times a day (BID) | INTRAVENOUS | Status: DC

## 2014-07-21 NOTE — Op Note (Signed)
[facility] [address] [place], [id number]   ENDOSCOPY PROCEDURE REPORT  PATIENT: [name], Myrka E  MR#: [id number] BIRTHDATE: 1918/06/03 , 89  yrs. old GENDER: female ENDOSCOPIST: Vida RiggerMarc Temperance Kelemen, MD REFERRED BY:  Juluis RainierElizabeth Barnes, M.D. PROCEDURE DATE:  07/21/2014 PROCEDURE:  EGD, diagnostic ASA CLASS:     Class III INDICATIONS:  hematemesis. MEDICATIONS: Fentanyl 50 mcg IV and Versed 2 mg IV TOPICAL ANESTHETIC: Cetacaine Spray  DESCRIPTION OF PROCEDURE: After the risks benefits and alternatives of the procedure were thoroughly explained, informed consent was obtained.  The Pentax Gastroscope [id number] endoscope was introduced through the mouth and advanced to the second portion of the duodenum , Without limitations.  The instrument was slowly withdrawn as the mucosa was fully examined.    the findings are recorded below       Retroflexed views revealed a hiatal hernia.     The scope was then withdrawn from the patient and the procedure completed.  COMPLICATIONS: There were no immediate complications.  ENDOSCOPIC IMPRESSION: 1 large hiatal hernia with probable partial volvulusand over 500 cc of dark fluid suctioned from the stomach but with some food particles that frequently clogged the scope 2. very difficult to advance past the twisted distal  stomachbut no signs of active bleeding in exam to the second portion of the duodenum 3   minimal oozing seen at GE junction secondary to partial twist and probably scope trauma but no active bleeding at the end of the procedure  RECOMMENDATIONS: sips of clear liquids okay long-term pump inhibitor consider surgical consultation if unable to advance diet when stable   REPEAT EXAM: as needed  eSigned:  Vida RiggerMarc Philena Obey, MD 07/21/2014 10:49 AM    XB:[id number]:Elizabeth Zachery DauerBarnes, MD  CPT CODES: ICD CODES:  The ICD and CPT codes recommended by this software are interpretations from the data that the clinical staff  has captured with the software.  The verification of the translation of this report to the ICD and CPT codes and modifiers is the sole responsibility of the health care institution and practicing physician where this report was generated.  [employer] will not be held responsible for the validity of the ICD and CPT codes included on this report.  AMA assumes no liability for data contained or not contained herein. CPT is a Publishing rights managerregistered trademark of the [employer].  PATIENT NAME:  [name], [name] MR#: [id number]

## 2014-07-21 NOTE — H&P (Signed)
[facility] History and Physical  Patient: [name]  UJW:[id number]  DOB: 08-10-1918  DOS: the patient was seen and examined on  07/21/2014 PCP: Freddy Jaksch, MD  Chief Complaint: Vomiting blood  HPI: [name] is a 80 y.o. female with Past medical history of GERD, hiatal hernia, moderate aortic stenosis, hypertension. The patient is presenting with complaints of vomiting of blood. Patient was at her baseline earlier during the day. She went to [employer] where she eats once a week earlier during the day. Later on in supper she had a salad which was in the fridge for last few days. After which he started having some acid reflux and then had an episode of vomiting. As per the daughter initially patient was vomiting yellow liquid. But later on she started having dark brown liquid in her vomit. Patient continues to have severe epigastric pain which she describes as discomfort as well as sharp burning pain. She had 2 Eskenazi motions and one of them was dark. She denies any dizziness lightheadedness or syncope. She denies use of any motrin Aleve naproxen recently. She has been diagnosed with GERD and was on Nexium but now is taking Pepcid. She also has been diagnosed with hiatal hernia but does not have any endoscopic procedure. She denies any prior bleeding. No sick contacts reported  The patient is coming from home And at her baseline independent for most of her ADL.  Review of Systems: as mentioned in the history of present illness.  A Comprehensive review of the other systems is negative.  Past Medical History  Diagnosis Date  . GERD (gastroesophageal reflux disease)   . Hyperlipidemia   . Moderate aortic stenosis   . HTN (hypertension)   . Heart murmur   . Hernia, epigastric    Past Surgical History  Procedure Laterality Date  . Appendectomy    . Cataract surgery    . Cholecystectomy    . Anterior cervical decomp/discectomy fusion     Social History:   reports that she has never smoked. She does not have any smokeless tobacco history on file. She reports that she does not drink alcohol. Her drug history is not on file.  Allergies  Allergen Reactions  . Codeine Nausea Only    Family History  Problem Relation Age of Onset  . Coronary artery disease      Postive in family    Prior to Admission medications   Medication Sig Start Date End Date Taking? Authorizing Provider  dextromethorphan-guaiFENesin (MUCINEX DM) 30-600 MG per 12 hr tablet Take 1 tablet by mouth daily as needed for cough.    Yes Historical Provider, MD  Famotidine (PEPCID PO) Take 1 tablet by mouth daily.   Yes Historical Provider, MD  loratadine (CLARITIN) 10 MG tablet Take 10 mg by mouth daily as needed for allergies.    Yes Historical Provider, MD  naproxen sodium (ANAPROX) 220 MG tablet Take 220 mg by mouth daily as needed (pain).    Yes Historical Provider, MD  Simethicone (GAS-X EXTRA STRENGTH PO) Take 1 tablet by mouth daily as needed (gas).    Yes Historical Provider, MD    Physical Exam: Filed Vitals:   07/21/14 0600 07/21/14 0615 07/21/14 0630 07/21/14 0645  BP: 140/60 148/59 126/84 155/68  Pulse: 85 85 78 91  Temp:      TempSrc:      Resp: 19 20 20 16   SpO2: 97% 98% 97% 95%    General: Alert, Awake and Oriented  to Time, Place and Person. Appear in mild distress Eyes: PERRL ENT: Oral Mucosa clear moist. Neck: no JVD Cardiovascular: S1 and S2 Present, aortic systolic Murmur, Peripheral Pulses Present Respiratory: Bilateral Air entry equal and Decreased, Clear to Auscultation, noCrackles, no wheezes Abdomen: Bowel Sound present, Soft and epigastric tenderness Skin: No Rash Extremities: No Pedal edema, no calf tenderness Neurologic: Grossly no focal neuro deficit.  Labs on Admission:  CBC:  Recent Labs Lab 07/21/14 0223  WBC 12.9*  NEUTROABS 11.5*  HGB 14.5  HCT 42.9  MCV 91.1  PLT 258    CMP     Component Value Date/Time   NA 141  07/21/2014 0223   K 4.1 07/21/2014 0223   CL 98 07/21/2014 0223   CO2 26 07/21/2014 0223   GLUCOSE 172* 07/21/2014 0223   BUN 15 07/21/2014 0223   CREATININE 0.73 07/21/2014 0223   CALCIUM 9.7 07/21/2014 0223   PROT 8.3 07/21/2014 0223   ALBUMIN 4.1 07/21/2014 0223   AST 18 07/21/2014 0223   ALT 14 07/21/2014 0223   ALKPHOS 76 07/21/2014 0223   BILITOT 0.3 07/21/2014 0223   GFRNONAA 71* 07/21/2014 0223   GFRAA 82* 07/21/2014 0223     Recent Labs Lab 07/21/14 0223  LIPASE 18   No results found for this basename: AMMONIA,  in the last 168 hours   Recent Labs Lab 07/21/14 0223  TROPONINI <0.30   BNP (last 3 results) No results found for this basename: PROBNP,  in the last 8760 hours  Radiological Exams on Admission: Dg Abd Acute W/chest  07/21/2014   CLINICAL DATA:  Initial evaluation for epigastric pain, nausea vomiting, hematemesis.  EXAM: ACUTE ABDOMEN SERIES (ABDOMEN 2 VIEW & CHEST 1 VIEW)  COMPARISON:  Prior radiograph from 03/13/2011  FINDINGS: Mild cardiomegaly is stable from prior study. Atherosclerotic calcifications present within the aortic arch. Mediastinal silhouette otherwise unremarkable.  Lungs are normally inflated. No focal infiltrate, pulmonary edema, or pleural effusion. There is no pneumothorax.  Large hiatal hernia with air-fluid level present.  ACDF overlies cervical spine.  Paucity of gas limits evaluation of the bowels. No evidence of obstruction or ileus. No free air. No soft tissue mass or abnormal calcifications. Cholecystectomy clips noted.  Mild dextroscoliosis of the lumbar spine with associated multilevel degenerative changes present. No acute osseus abnormality. Degenerative changes about the hips bilaterally is well.  IMPRESSION: 1. Large hiatal hernia.  Nonobstructive bowel gas pattern. 2. Stable cardiomegaly with no acute cardiopulmonary abnormality.   Electronically Signed   By: Rise MuBenjamin  McClintock M.D.   On: 07/21/2014 03:09    Assessment/Plan Principal  Problem:   Hematemesis Active Problems:   Aortic stenosis   HTN (hypertension)   1. Hematemesis The patient is presenting with complaints of abdominal pain along with coffee-ground emesis. She has been having on episode roughly one hour. She has clots with coffee-ground emesis on my evaluation, roughly 200 cc of vomitus. Patient continues to have abdominal pain and a sense of nausea. She denies any fever or chills. X-ray is negative for any free air. She does not have any signs of peritonitis at present. With this patient is currently received IV Protonix bolus and is on Protonix infusion. I will use IV Zofran 81 mg as needed for nausea. Patient will remain n.p.o. and gastroenterology will followup with the patient for possible procedure. Differential included Mallory-Weiss tear since the patient initially had clear vomitus and later on coffee ground emesis. But also possibility of gastric ulcers/gastritis cannot be  ruled out. Aggressive IV hydration will be provided. I would check her CBC now. If there is further drop she will receive PRBC as needed.  2. Aortic stenosis. Patient had a moderate aortic stenosis on echocardiogram 2010. Continue to monitor and avoid hypotension. Patient will be moderate risk of anesthesia.  Advance goals of care discussion: Discussed with the patient in the presence of daughter. Patient has a advance directive form which is not notorized at present Patient mentions that "at 53 she does not see the need for keeping a person alive." And requested to remain DNR/DNI, daughter agrees with the plan.  Consults: Gastroenterology  DVT Prophylaxis: mechanical compression device Nutrition: N.p.o. except medication  Family Communication: Daughter was present at bedside, opportunity was given to ask question and all questions were answered satisfactorily at the time of interview. Disposition: Admitted to inpatient in step-down unit.  Author: Lynden Oxford, MD [facility] Pager: [phone] 07/21/2014, 7:07 AM    If 7PM-7AM, please contact night-coverage [email or web address] Password [email or web address]

## 2014-07-21 NOTE — ED Notes (Signed)
Pt. To be transferred to Endoscopy. Daughter went to her car, unable to find her will direct her to endoscopy

## 2014-07-21 NOTE — ED Notes (Signed)
MD at bedside. 

## 2014-07-21 NOTE — ED Notes (Signed)
Admitting MD at bedside.

## 2014-07-21 NOTE — Consult Note (Signed)
Reason for Consult: Hematemesis Referring Physician: Hospital team  [name] is an 80 y.o. female.  HPI: Patient known to me from colonoscopy years ago and recently followed by my partner with a long history of esophageal reflux and paraesophageal hiatal hernia who was recently changed from Nexium to Pepcid but unfortunately began throwing up blood and having some upper abdominal pain and presented to the emergency room and we are consulted for urgent endoscopy she does not believe she's never had an endoscopy but our office computer was reviewed as was her previous barium swallow and colonoscopy and other than a chronic cough she has no other complaints and no recent new swallowing problem  Past Medical History  Diagnosis Date  . GERD (gastroesophageal reflux disease)   . Hyperlipidemia   . Moderate aortic stenosis   . HTN (hypertension)   . Heart murmur   . Hernia, epigastric     Past Surgical History  Procedure Laterality Date  . Appendectomy    . Cataract surgery    . Cholecystectomy    . Anterior cervical decomp/discectomy fusion      Family History  Problem Relation Age of Onset  . Coronary artery disease      Postive in family    Social History:  reports that she has never smoked. She does not have any smokeless tobacco history on file. She reports that she does not drink alcohol. Her drug history is not on file.  Allergies:  Allergies  Allergen Reactions  . Codeine Nausea Only    Medications: I have reviewed the patient's current medications.  Results for orders placed during the hospital encounter of 07/21/14 (from the past 48 hour(s))  TYPE AND SCREEN     Status: None   Collection Time    07/21/14  2:10 AM      Result Value Ref Range   ABO/RH(D) O POS     Antibody Screen NEG     Sample Expiration 07/24/2014    ABO/RH     Status: None   Collection Time    07/21/14  2:10 AM      Result Value Ref Range   ABO/RH(D) O POS    CBC WITH DIFFERENTIAL      Status: Abnormal   Collection Time    07/21/14  2:23 AM      Result Value Ref Range   WBC 12.9 (*) 4.0 - 10.5 K/uL   RBC 4.71  3.87 - 5.11 MIL/uL   Hemoglobin 14.5  12.0 - 15.0 g/dL   HCT 42.9  36.0 - 46.0 %   MCV 91.1  78.0 - 100.0 fL   MCH 30.8  26.0 - 34.0 pg   MCHC 33.8  30.0 - 36.0 g/dL   RDW 13.9  11.5 - 15.5 %   Platelets 258  150 - 400 K/uL   Neutrophils Relative % 89 (*) 43 - 77 %   Neutro Abs 11.5 (*) 1.7 - 7.7 K/uL   Lymphocytes Relative 8 (*) 12 - 46 %   Lymphs Abs 1.1  0.7 - 4.0 K/uL   Monocytes Relative 3  3 - 12 %   Monocytes Absolute 0.3  0.1 - 1.0 K/uL   Eosinophils Relative 0  0 - 5 %   Eosinophils Absolute 0.0  0.0 - 0.7 K/uL   Basophils Relative 0  0 - 1 %   Basophils Absolute 0.0  0.0 - 0.1 K/uL  COMPREHENSIVE METABOLIC PANEL     Status: Abnormal  Collection Time    07/21/14  2:23 AM      Result Value Ref Range   Sodium 141  137 - 147 mEq/L   Potassium 4.1  3.7 - 5.3 mEq/L   Chloride 98  96 - 112 mEq/L   CO2 26  19 - 32 mEq/L   Glucose, Bld 172 (*) 70 - 99 mg/dL   BUN 15  6 - 23 mg/dL   Creatinine, Ser 0.73  0.50 - 1.10 mg/dL   Calcium 9.7  8.4 - 10.5 mg/dL   Total Protein 8.3  6.0 - 8.3 g/dL   Albumin 4.1  3.5 - 5.2 g/dL   AST 18  0 - 37 U/L   ALT 14  0 - 35 U/L   Alkaline Phosphatase 76  39 - 117 U/L   Total Bilirubin 0.3  0.3 - 1.2 mg/dL   GFR calc non Af Amer 71 (*) >90 mL/min   GFR calc Af Amer 82 (*) >90 mL/min   Comment: (NOTE)     The eGFR has been calculated using the CKD EPI equation.     This calculation has not been validated in all clinical situations.     eGFR's persistently <90 mL/min signify possible Chronic Kidney     Disease.   Anion gap 17 (*) 5 - 15  LIPASE, BLOOD     Status: None   Collection Time    07/21/14  2:23 AM      Result Value Ref Range   Lipase 18  11 - 59 U/L  APTT     Status: Abnormal   Collection Time    07/21/14  2:23 AM      Result Value Ref Range   aPTT 23 (*) 24 - 37 seconds  PROTIME-INR     Status:  None   Collection Time    07/21/14  2:23 AM      Result Value Ref Range   Prothrombin Time 12.7  11.6 - 15.2 seconds   INR 0.95  0.00 - 1.49  TROPONIN I     Status: None   Collection Time    07/21/14  2:23 AM      Result Value Ref Range   Troponin I <0.30  <0.30 ng/mL   Comment:            Due to the release kinetics of cTnI,     a negative result within the first hours     of the onset of symptoms does not rule out     myocardial infarction with certainty.     If myocardial infarction is still suspected,     repeat the test at appropriate intervals.  POCT GASTRIC OCCULT BLOOD (1-CARD TO LAB)     Status: Abnormal   Collection Time    07/21/14  2:23 AM      Result Value Ref Range   Occult Blood, Gastric POSITIVE (*) NEGATIVE  URINALYSIS, ROUTINE W REFLEX MICROSCOPIC     Status: Abnormal   Collection Time    07/21/14  3:35 AM      Result Value Ref Range   Color, Urine YELLOW  YELLOW   APPearance CLOUDY (*) CLEAR   Specific Gravity, Urine 1.021  1.005 - 1.030   pH 8.0  5.0 - 8.0   Glucose, UA NEGATIVE  NEGATIVE mg/dL   Hgb urine dipstick NEGATIVE  NEGATIVE   Bilirubin Urine NEGATIVE  NEGATIVE   Ketones, ur 15 (*) NEGATIVE mg/dL   Protein, ur  NEGATIVE  NEGATIVE mg/dL   Urobilinogen, UA 0.2  0.0 - 1.0 mg/dL   Nitrite NEGATIVE  NEGATIVE   Leukocytes, UA SMALL (*) NEGATIVE  URINE MICROSCOPIC-ADD ON     Status: Abnormal   Collection Time    07/21/14  3:35 AM      Result Value Ref Range   Squamous Epithelial / LPF RARE  RARE   WBC, UA 3-6  <3 WBC/hpf   Bacteria, UA FEW (*) RARE    Dg Abd Acute W/chest  07/21/2014   CLINICAL DATA:  Initial evaluation for epigastric pain, nausea vomiting, hematemesis.  EXAM: ACUTE ABDOMEN SERIES (ABDOMEN 2 VIEW & CHEST 1 VIEW)  COMPARISON:  Prior radiograph from 03/13/2011  FINDINGS: Mild cardiomegaly is stable from prior study. Atherosclerotic calcifications present within the aortic arch. Mediastinal silhouette otherwise unremarkable.  Lungs  are normally inflated. No focal infiltrate, pulmonary edema, or pleural effusion. There is no pneumothorax.  Large hiatal hernia with air-fluid level present.  ACDF overlies cervical spine.  Paucity of gas limits evaluation of the bowels. No evidence of obstruction or ileus. No free air. No soft tissue mass or abnormal calcifications. Cholecystectomy clips noted.  Mild dextroscoliosis of the lumbar spine with associated multilevel degenerative changes present. No acute osseus abnormality. Degenerative changes about the hips bilaterally is well.  IMPRESSION: 1. Large hiatal hernia.  Nonobstructive bowel gas pattern. 2. Stable cardiomegaly with no acute cardiopulmonary abnormality.   Electronically Signed   By: Jeannine Boga M.D.   On: 07/21/2014 03:09    ROS negative except above and case discussed with her daughter as well Blood pressure 145/62, pulse 84, temperature 98.2 F (36.8 C), temperature source Oral, resp. rate 17, SpO2 92.00%. Physical Exam vital signs stable afebrile no acute distress exam please see pre-assessment evaluation specific for her abdomen being soft nontender labs reviewed hospital computer chart reviewed Assessment/Plan: Upper GI bleeding Plan: The risks benefits methods of endoscopy was discussed with the patient and her daughter and will proceed this morning with further workup and plans pending those findings Lacoya Wilbanks E 07/21/2014, 9:56 AM

## 2014-07-21 NOTE — ED Notes (Signed)
Report given to Deforest HoylesKathra, RN at Endoscopy

## 2014-07-21 NOTE — ED Provider Notes (Signed)
TIME SEEN: 2:15 AM  CHIEF COMPLAINT: Abdominal pain, vomiting  HPI: Patient is a 80 year old female with history of GERD, hyperlipidemia, hypertension, aortic stenosis, hiatal hernia who presents to the emergency department with epigastric pain that she is unable to describe, nausea and vomiting that started tonight. She states that she did have some emesis that appeared to be coffee ground. No bright red blood or bilious vomiting. Denies fevers or chills. Denies chest pain or shortness of breath. Denies diarrhea, hematochezia or melena. She has history of chronic constipation. Last bowel movement was yesterday. She is status post hysterectomy, cholecystectomy, appendectomy. Denies a prior history of endoscopy. No prior history of peptic ulcer. No history of alcohol use or esophageal varices. No heavy NSAID use. She is not on anticoagulation.  PCP Juluis RainierElizabeth Barnes  Gastroenterologist is Dr. Dulce Sellarutlaw  ROS: See HPI Constitutional: no fever  Eyes: no drainage  ENT: no runny nose   Cardiovascular:  no chest pain  Resp: no SOB  GI: no vomiting GU: no dysuria Integumentary: no rash  Allergy: no hives  Musculoskeletal: no leg swelling  Neurological: no slurred speech ROS otherwise negative  PAST MEDICAL HISTORY/PAST SURGICAL HISTORY:  Past Medical History  Diagnosis Date  . GERD (gastroesophageal reflux disease)   . Hyperlipidemia   . Moderate aortic stenosis   . HTN (hypertension)   . Heart murmur   . Hernia, epigastric     MEDICATIONS:  Prior to Admission medications   Medication Sig Start Date End Date Taking? Authorizing Provider  dextromethorphan-guaiFENesin (MUCINEX DM) 30-600 MG per 12 hr tablet Take 1 tablet by mouth as needed.      Historical Provider, MD  esomeprazole (NEXIUM) 40 MG capsule Take 40 mg by mouth daily before breakfast.      Historical Provider, MD  loratadine (CLARITIN) 10 MG tablet Take 10 mg by mouth as needed.      Historical Provider, MD  naproxen sodium  (ANAPROX) 220 MG tablet Take 220 mg by mouth as needed.      Historical Provider, MD  Simethicone (GAS-X EXTRA STRENGTH PO) Take by mouth as needed.      Historical Provider, MD    ALLERGIES:  Allergies  Allergen Reactions  . Codeine     SOCIAL HISTORY:  History  Substance Use Topics  . Smoking status: Never Smoker   . Smokeless tobacco: Not on file  . Alcohol Use: No    FAMILY HISTORY: Family History  Problem Relation Age of Onset  . Coronary artery disease      Postive in family    EXAM: BP 165/78  Pulse 87  Temp(Src) 98.2 F (36.8 C) (Oral)  Resp 22  SpO2 94% CONSTITUTIONAL: Alert and oriented and responds appropriately to questions. Elderly, appears uncomfortable, nontoxic, patient actively vomiting brown-appearing vomitus without bright red blood HEAD: Normocephalic EYES: Conjunctivae clear, PERRL ENT: normal nose; no rhinorrhea; moist mucous membranes; pharynx without lesions noted NECK: Supple, no meningismus, no LAD  CARD: RRR; S1 and S2 appreciated; no murmurs, no clicks, no rubs, no gallops RESP: Normal chest excursion without splinting or tachypnea; breath sounds clear and equal bilaterally; no wheezes, no rhonchi, no rales,  ABD/GI: Normal bowel sounds; non-distended; soft, tender to palpation in the epigastric region with mild voluntary guarding, no rebound, no peritoneal signs BACK:  The back appears normal and is non-tender to palpation, there is no CVA tenderness EXT: Normal ROM in all joints; non-tender to palpation; no edema; normal capillary refill; no cyanosis  SKIN: Normal color for age and race; warm NEURO: Moves all extremities equally PSYCH: The patient's mood and manner are appropriate. Grooming and personal hygiene are appropriate.  MEDICAL DECISION MAKING: Patient here with coffee ground emesis, abdominal pain. Concern for gastritis, peptic ulcer disease, perforated ulcer, pancreatitis. Will obtain abdominal labs, coags, type and screen,  acute abdominal series, troponin. We'll give Zofran and IV fluids. We'll send a gastric occult card to test for blood. Anticipate patient will need admission. EKG shows no new ischemic changes.  ED PROGRESS: Patient's labs show leukocytosis with left shift. Her hemoglobin is 14.5. Urine does show small leukocytes and few bacteria. Culture pending. We'll give Rocephin. She's been started on Protonix bolus and drip for presumed gastritis, peptic ulcer. She still having some hematemesis. Acute abdominal series shows no perforated ulcer, no free air. Discussed with on call physician for [facility] gastroenterology who will see the patient. Will discuss with hospitalist for admission.   Discussed with Dr. Allena Katz with hospitalist service for permission to inpatient, step-down.   EKG Interpretation  Date/Time:  Wednesday July 21 2014 01:41:04 EDT Ventricular Rate:  77 PR Interval:  233 QRS Duration: 119 QT Interval:  465 QTC Calculation: 526 R Axis:   -46 Text Interpretation:  Age not entered, assumed to be  80 years old for purpose of ECG interpretation Sinus rhythm Sinus pause Prolonged PR interval LAD, consider left anterior fascicular block Consider anterior infarct Artifact in lead(s) I II III aVR aVL aVF No significant change since last tracing Confirmed by Avraj Lindroth,  DO, Anavictoria Wilk ([id number]) on 07/21/2014 1:46:46 AM         CRITICAL CARE Performed by: Raelyn Number   Total critical care time: 45 minutes  Critical care time was exclusive of separately billable procedures and treating other patients.  Critical care was necessary to treat or prevent imminent or life-threatening deterioration.  Critical care was time spent personally by me on the following activities: development of treatment plan with patient and/or surrogate as well as nursing, discussions with consultants, evaluation of patient's response to treatment, examination of patient, obtaining history from patient or surrogate, ordering  and performing treatments and interventions, ordering and review of laboratory studies, ordering and review of radiographic studies, pulse oximetry and re-evaluation of patient's condition.     Layla Maw Tora Prunty, DO 07/21/14 [phone]

## 2014-07-21 NOTE — ED Notes (Signed)
Per Dr. Allena KatzPatel, push 8mg  of zofran together.

## 2014-07-21 NOTE — ED Notes (Addendum)
Pt reports epigastric pain that started around 5pm after eating. Pt goes to see a GI doctor for problems with her esophagus. Pt is having dark coffee ground emesis. Pt states she has vomited almost every 15-10630mins. Pt becomes bradycardic when she has pain to epigastric area.

## 2014-07-21 NOTE — Addendum Note (Signed)
Addended byVida Rigger: Illiana Losurdo on: 07/21/2014 03:16 PM   Modules accepted: Orders

## 2014-07-21 NOTE — ED Notes (Signed)
Ice chips given

## 2014-07-21 NOTE — Consult Note (Signed)
Reason for Consult:Gastric volvulus Referring Physician: [name] is an 80 y.o. female.  HPI: [name] was in her usual state of health when she developed acute onset abdominal pain. She took some Pepcid and Gas-X without resolution of her symptoms. She called her daughter to come over and by the time she got there she had developed N/V that initially was yellow but then turned brown. She was brought to the ED and diagnosed with an upper GI bleed. She was taken to endoscopy where she was noted to have a gastric volvulus. She has a long history of a significant hiatal hernia. She is currently asymptomatic and has not vomited at all today.  Past Medical History  Diagnosis Date  . GERD (gastroesophageal reflux disease)   . Hyperlipidemia   . Moderate aortic stenosis   . HTN (hypertension)   . Heart murmur   . Hernia, epigastric     Past Surgical History  Procedure Laterality Date  . Appendectomy    . Cataract surgery    . Cholecystectomy    . Anterior cervical decomp/discectomy fusion      Family History  Problem Relation Age of Onset  . Coronary artery disease      Postive in family    Social History:  reports that she has never smoked. She does not have any smokeless tobacco history on file. She reports that she does not drink alcohol. Her drug history is not on file.  Allergies:  Allergies  Allergen Reactions  . Codeine Nausea Only    Medications: I have reviewed the patient's current medications.  Results for orders placed during the hospital encounter of 07/21/14 (from the past 48 hour(s))  TYPE AND SCREEN     Status: None   Collection Time    07/21/14  2:10 AM      Result Value Ref Range   ABO/RH(D) O POS     Antibody Screen NEG     Sample Expiration 07/24/2014    ABO/RH     Status: None   Collection Time    07/21/14  2:10 AM      Result Value Ref Range   ABO/RH(D) O POS    CBC WITH DIFFERENTIAL     Status: Abnormal   Collection Time    07/21/14   2:23 AM      Result Value Ref Range   WBC 12.9 (*) 4.0 - 10.5 K/uL   RBC 4.71  3.87 - 5.11 MIL/uL   Hemoglobin 14.5  12.0 - 15.0 g/dL   HCT 42.9  36.0 - 46.0 %   MCV 91.1  78.0 - 100.0 fL   MCH 30.8  26.0 - 34.0 pg   MCHC 33.8  30.0 - 36.0 g/dL   RDW 13.9  11.5 - 15.5 %   Platelets 258  150 - 400 K/uL   Neutrophils Relative % 89 (*) 43 - 77 %   Neutro Abs 11.5 (*) 1.7 - 7.7 K/uL   Lymphocytes Relative 8 (*) 12 - 46 %   Lymphs Abs 1.1  0.7 - 4.0 K/uL   Monocytes Relative 3  3 - 12 %   Monocytes Absolute 0.3  0.1 - 1.0 K/uL   Eosinophils Relative 0  0 - 5 %   Eosinophils Absolute 0.0  0.0 - 0.7 K/uL   Basophils Relative 0  0 - 1 %   Basophils Absolute 0.0  0.0 - 0.1 K/uL  COMPREHENSIVE METABOLIC PANEL     Status: Abnormal  Collection Time    07/21/14  2:23 AM      Result Value Ref Range   Sodium 141  137 - 147 mEq/L   Potassium 4.1  3.7 - 5.3 mEq/L   Chloride 98  96 - 112 mEq/L   CO2 26  19 - 32 mEq/L   Glucose, Bld 172 (*) 70 - 99 mg/dL   BUN 15  6 - 23 mg/dL   Creatinine, Ser 0.73  0.50 - 1.10 mg/dL   Calcium 9.7  8.4 - 10.5 mg/dL   Total Protein 8.3  6.0 - 8.3 g/dL   Albumin 4.1  3.5 - 5.2 g/dL   AST 18  0 - 37 U/L   ALT 14  0 - 35 U/L   Alkaline Phosphatase 76  39 - 117 U/L   Total Bilirubin 0.3  0.3 - 1.2 mg/dL   GFR calc non Af Amer 71 (*) >90 mL/min   GFR calc Af Amer 82 (*) >90 mL/min   Comment: (NOTE)     The eGFR has been calculated using the CKD EPI equation.     This calculation has not been validated in all clinical situations.     eGFR's persistently <90 mL/min signify possible Chronic Kidney     Disease.   Anion gap 17 (*) 5 - 15  LIPASE, BLOOD     Status: None   Collection Time    07/21/14  2:23 AM      Result Value Ref Range   Lipase 18  11 - 59 U/L  APTT     Status: Abnormal   Collection Time    07/21/14  2:23 AM      Result Value Ref Range   aPTT 23 (*) 24 - 37 seconds  PROTIME-INR     Status: None   Collection Time    07/21/14  2:23 AM       Result Value Ref Range   Prothrombin Time 12.7  11.6 - 15.2 seconds   INR 0.95  0.00 - 1.49  TROPONIN I     Status: None   Collection Time    07/21/14  2:23 AM      Result Value Ref Range   Troponin I <0.30  <0.30 ng/mL   Comment:            Due to the release kinetics of cTnI,     a negative result within the first hours     of the onset of symptoms does not rule out     myocardial infarction with certainty.     If myocardial infarction is still suspected,     repeat the test at appropriate intervals.  POCT GASTRIC OCCULT BLOOD (1-CARD TO LAB)     Status: Abnormal   Collection Time    07/21/14  2:23 AM      Result Value Ref Range   Occult Blood, Gastric POSITIVE (*) NEGATIVE  URINALYSIS, ROUTINE W REFLEX MICROSCOPIC     Status: Abnormal   Collection Time    07/21/14  3:35 AM      Result Value Ref Range   Color, Urine YELLOW  YELLOW   APPearance CLOUDY (*) CLEAR   Specific Gravity, Urine 1.021  1.005 - 1.030   pH 8.0  5.0 - 8.0   Glucose, UA NEGATIVE  NEGATIVE mg/dL   Hgb urine dipstick NEGATIVE  NEGATIVE   Bilirubin Urine NEGATIVE  NEGATIVE   Ketones, ur 15 (*) NEGATIVE mg/dL   Protein, ur  NEGATIVE  NEGATIVE mg/dL   Urobilinogen, UA 0.2  0.0 - 1.0 mg/dL   Nitrite NEGATIVE  NEGATIVE   Leukocytes, UA SMALL (*) NEGATIVE  URINE MICROSCOPIC-ADD ON     Status: Abnormal   Collection Time    07/21/14  3:35 AM      Result Value Ref Range   Squamous Epithelial / LPF RARE  RARE   WBC, UA 3-6  <3 WBC/hpf   Bacteria, UA FEW (*) RARE  CBC     Status: Abnormal   Collection Time    07/21/14 12:52 PM      Result Value Ref Range   WBC 17.0 (*) 4.0 - 10.5 K/uL   RBC 4.04  3.87 - 5.11 MIL/uL   Hemoglobin 12.5  12.0 - 15.0 g/dL   HCT 37.2  36.0 - 46.0 %   MCV 92.1  78.0 - 100.0 fL   MCH 30.9  26.0 - 34.0 pg   MCHC 33.6  30.0 - 36.0 g/dL   RDW 14.0  11.5 - 15.5 %   Platelets 246  150 - 400 K/uL    Dg Abd Acute W/chest  07/21/2014   CLINICAL DATA:  Initial evaluation for  epigastric pain, nausea vomiting, hematemesis.  EXAM: ACUTE ABDOMEN SERIES (ABDOMEN 2 VIEW & CHEST 1 VIEW)  COMPARISON:  Prior radiograph from 03/13/2011  FINDINGS: Mild cardiomegaly is stable from prior study. Atherosclerotic calcifications present within the aortic arch. Mediastinal silhouette otherwise unremarkable.  Lungs are normally inflated. No focal infiltrate, pulmonary edema, or pleural effusion. There is no pneumothorax.  Large hiatal hernia with air-fluid level present.  ACDF overlies cervical spine.  Paucity of gas limits evaluation of the bowels. No evidence of obstruction or ileus. No free air. No soft tissue mass or abnormal calcifications. Cholecystectomy clips noted.  Mild dextroscoliosis of the lumbar spine with associated multilevel degenerative changes present. No acute osseus abnormality. Degenerative changes about the hips bilaterally is well.  IMPRESSION: 1. Large hiatal hernia.  Nonobstructive bowel gas pattern. 2. Stable cardiomegaly with no acute cardiopulmonary abnormality.   Electronically Signed   By: Jeannine Boga M.D.   On: 07/21/2014 03:09    Review of Systems  Constitutional: Negative for fever, chills and malaise/fatigue.  Gastrointestinal: Positive for nausea, vomiting and abdominal pain. Negative for diarrhea and constipation.  All other systems reviewed and are negative.  Blood pressure 111/90, pulse 74, temperature 98.2 F (36.8 C), temperature source Oral, resp. rate 18, SpO2 90.00%. Physical Exam  Constitutional: She appears well-developed and well-nourished. No distress.  HENT:  Head: Normocephalic.  Eyes: Conjunctivae are normal. Right eye exhibits no discharge. Left eye exhibits no discharge. No scleral icterus.  Neck: Normal range of motion. Neck supple.  Cardiovascular: Normal rate, regular rhythm and normal heart sounds.  Exam reveals no gallop and no friction rub.   No murmur heard. Respiratory: Effort normal and breath sounds normal. No  respiratory distress. She has no wheezes. She has no rales.  GI: Soft. Bowel sounds are normal. She exhibits no distension. There is no tenderness. There is no rebound and no guarding.  Musculoskeletal: Normal range of motion.  Lymphadenopathy:    She has no cervical adenopathy.  Neurological: She is alert.  Skin: Skin is warm and dry. She is not diaphoretic.  Psychiatric: She has a normal mood and affect. Her behavior is normal.    Assessment/Plan: Gastric volvulus -- This is likely long-standing and not the source of her symptoms. Agree with GI plan to re-initiate  feeds. The patient is not a candidate for nor does she desire surgery to correct this. I explained that if she was unable to eat we might consider a j-tube for enteral access but the patient is not interested in this either. At this point there is no role for surgical involvement. Please call if you have further questions.    Lisette Abu, PA-C Pager: [phone] General Trauma PA Pager: [phone] 07/21/2014, 3:55 PM

## 2014-07-21 NOTE — ED Notes (Addendum)
Patient from home with nausea and vomiting.  Patient states that she has epigastric pain with the nausea and vomiting.  Patient denies any chest pain or shortness of breath.  Patient does become bradycardic in the 40's with the epigastric pain.  Patient is CAOx3.  Patient was given 4mg  Zofran with no relief.

## 2014-07-21 NOTE — ED Notes (Signed)
Diet tray ordered 

## 2014-07-21 NOTE — ED Notes (Signed)
Attempted report X1

## 2014-07-21 NOTE — Progress Notes (Signed)
Patient seen and examined, 80 year old female with history of GERD, hiatal hernia, moderate aortic stenosis, hypertension presented with coffee-ground emesis.  BP 103/40  Pulse 74  Temp(Src) 98.2 F (36.8 C) (Oral)  Resp 17  SpO2 90%  Patient seen and examined in ED, agree with assessment and plan 1) upper GI bleed with coffee-ground emesis - GI consulted, patient underwent urgent EGD which showed large hiatal hernia with probable partial volvulus, over 500 cc of dark fluid suctioned from the stomach with some food particles, difficult to advance past the distal distal stomach per EGD note - Placed on sips of clears, surgery consult called   RAI,RIPUDEEP M.D. [facility] 07/21/2014, 2:05 PM  Pager: [phone]

## 2014-07-22 ENCOUNTER — Inpatient Hospital Stay (HOSPITAL_COMMUNITY): Payer: Medicare Other

## 2014-07-22 ENCOUNTER — Encounter (HOSPITAL_COMMUNITY): Payer: Self-pay | Admitting: Gastroenterology

## 2014-07-22 DIAGNOSIS — J9600 Acute respiratory failure, unspecified whether with hypoxia or hypercapnia: Secondary | ICD-10-CM | POA: Diagnosis present

## 2014-07-22 DIAGNOSIS — R0600 Dyspnea, unspecified: Secondary | ICD-10-CM

## 2014-07-22 DIAGNOSIS — J040 Acute laryngitis: Secondary | ICD-10-CM | POA: Diagnosis present

## 2014-07-22 LAB — URINALYSIS, ROUTINE W REFLEX MICROSCOPIC
Bilirubin Urine: NEGATIVE
GLUCOSE, UA: NEGATIVE mg/dL
HGB URINE DIPSTICK: NEGATIVE
Ketones, ur: NEGATIVE mg/dL
Leukocytes, UA: NEGATIVE
Nitrite: NEGATIVE
PH: 5 (ref 5.0–8.0)
Protein, ur: NEGATIVE mg/dL
SPECIFIC GRAVITY, URINE: 1.004 — AB (ref 1.005–1.030)
Urobilinogen, UA: 0.2 mg/dL (ref 0.0–1.0)

## 2014-07-22 LAB — URINE CULTURE: Colony Count: >=100000

## 2014-07-22 MED ORDER — MENTHOL 3 MG MT LOZG
1.0000 | LOZENGE | OROMUCOSAL | Status: DC | PRN
  Administered 2014-07-24: 3 mg via ORAL
  Filled 2014-07-22: qty 9

## 2014-07-22 MED ORDER — PHENOL 1.4 % MT LIQD
1.0000 | OROMUCOSAL | Status: DC | PRN
  Filled 2014-07-22: qty 177

## 2014-07-22 MED ORDER — FUROSEMIDE 10 MG/ML IJ SOLN
20.0000 mg | Freq: Once | INTRAMUSCULAR | Status: AC
  Administered 2014-07-22: 20 mg via INTRAVENOUS
  Filled 2014-07-22: qty 2

## 2014-07-22 MED ORDER — ACETAMINOPHEN 325 MG PO TABS: 650.0000 mg | ORAL_TABLET | Freq: Four times a day (QID) | ORAL | Status: DC | PRN

## 2014-07-22 MED ORDER — AMOXICILLIN-POT CLAVULANATE 875-125 MG PO TABS
1.0000 | ORAL_TABLET | Freq: Two times a day (BID) | ORAL | Status: DC
  Administered 2014-07-22 – 2014-07-25 (×7): 1 via ORAL
  Filled 2014-07-22 (×8): qty 1

## 2014-07-22 NOTE — Progress Notes (Signed)
Patient ID: [name]  female  ZOX:[id number]    DOB: 1918-09-12    DOA: 07/21/2014  PCP: Freddy JakschUTLAW,WILLIAM M, MD  Brief history of present illness 80 year old female with history of GERD, hiatal hernia, moderate aortic stenosis, hypertension presented with coffee-ground emesis. GI has been consulted. Patient underwent EGD which showed large hiatal hernia with probable partial volvulus. Patient has been seen by surgery and recommendation nonoperative management.    Assessment/Plan: Principal Problem:   Hematemesis with Paraesophageal hernia with partial volvulus; improving - GI following, patient underwent EGD which showed large hiatal hernia with probable partial volvulus - General surgery was consulted and felt that this is likely long-standing issue and she is not a candidate for surgery. If unable to eat, consider J-tube for enteral access however patient is not interested. - Patient has tolerated sips of clears, advance diet to clear liquids, full liquids tomorrow - Continue PPI  Active Problems: Acute hypoxic respiratory failure - Patient on O2 nasal cannula at 4 L, chest exam with bibasilar crackles, chest x-ray showed interstitial edema - Will give furosemide, 20 mg IV x1 - Discontinue the IV fluids  acute laryngitis - Placed on Cepacol and Augmentin    Aortic stenosis - Discontinue IV fluids    HTN (hypertension): Currently borderline hypotension - Hold antihypertensives  Leukocytosis: - No fevers or chills, recheck UA, has acute laryngitis, placed on Augmentin today. No pneumonia on chest x-ray  DVT Prophylaxis:SCDs   Code Status:DO NOT RESUSCITATE   Family Communication:  Disposition:  Consultants: Gastroenterology Surgery  Procedures: ENDOSCOPIC IMPRESSION:  1 large hiatal hernia with probable partial volvulusand over 500 cc  of dark fluid suctioned from the stomach but with some food  particles that frequently clogged the scope 2. very difficult to   advance past the twisted distal stomachbut no signs of active  bleeding in exam to the second portion of the duodenum 3 minimal  oozing seen at GE junction secondary to partial twist and probably  scope trauma but no active bleeding at the end of the procedure    Antibiotics:  Augmentin    Subjective: Patient seen and examined, feeling somewhat better today, not active GI bleeding or abdominal pain. noticed on 4 L of O2 via nasal cannula   Objective: Weight change:   Intake/Output Summary (Last 24 hours) at 07/22/14 1207 Last data filed at 07/22/14 0800  Gross per 24 hour  Intake   1230 ml  Output    500 ml  Net    730 ml   Blood pressure 113/51, pulse 78, temperature 98.7 F (37.1 C), temperature source Axillary, resp. rate 23, height 5\' 3"  (1.6 m), weight 71.1 kg (156 lb 12 oz), SpO2 94.00%.  Physical Exam: General: Alert and awake, oriented x3, not in any acute distress. CVS: S1-S2 clear, no murmur rubs or gallops Chest: Bibasilar crackles Abdomen: soft nontender, nondistended, normal bowel sounds  Extremities: no cyanosis, clubbing or edema noted bilaterally   Lab Results: Basic Metabolic Panel:  Recent Labs Lab 07/21/14 0223  NA 141  K 4.1  CL 98  CO2 26  GLUCOSE 172*  BUN 15  CREATININE 0.73  CALCIUM 9.7   Liver Function Tests:  Recent Labs Lab 07/21/14 0223  AST 18  ALT 14  ALKPHOS 76  BILITOT 0.3  PROT 8.3  ALBUMIN 4.1    Recent Labs Lab 07/21/14 0223  LIPASE 18   No results found for this basename: AMMONIA,  in the last 168 hours CBC:  Recent Labs Lab 07/21/14 0223 07/21/14 1252  WBC 12.9* 17.0*  NEUTROABS 11.5*  --   HGB 14.5 12.5  HCT 42.9 37.2  MCV 91.1 92.1  PLT 258 246   Cardiac Enzymes:  Recent Labs Lab 07/21/14 0223  TROPONINI <0.30   BNP: No components found with this basename: POCBNP,  CBG: No results found for this basename: GLUCAP,  in the last 168 hours   Micro Results: Recent Results (from the  past 240 hour(s))  MRSA PCR SCREENING     Status: None   Collection Time    07/21/14  8:51 PM      Result Value Ref Range Status   MRSA by PCR NEGATIVE  NEGATIVE Final   Comment:            The GeneXpert MRSA Assay (FDA     approved for NASAL specimens     only), is one component of a     comprehensive MRSA colonization     surveillance program. It is not     intended to diagnose MRSA     infection nor to guide or     monitor treatment for     MRSA infections.    Studies/Results: Dg Chest Port 1 View  07/22/2014   CLINICAL DATA:  Dyspnea with history of hypertension and heart murmur and reflux  EXAM: PORTABLE CHEST - 1 VIEW  COMPARISON:  Acute abdominal series of July 21, 2014  FINDINGS: The lungs are well-expanded. The interstitial markings remain increased predominantly on the left in the mid and lower lung zone. The cardiopericardial silhouette top-normal in size. There is a large hiatal hernia-partially intrathoracic stomach. There is no pleural effusion or pneumothorax. There is mild tortuosity of the descending thoracic aorta. The bony thorax is unremarkable.  IMPRESSION: There remain increased interstitial markings predominantly on the left in the mid and lower lung consistent with interstitial edema. There is no alveolar pneumonia. A large hiatal hernia-partially intrathoracic stomach persists placing the patient at aspiration risk.   Electronically Signed   By: David  [place]   On: 07/22/2014 08:05   Dg Abd Acute W/chest  07/21/2014   CLINICAL DATA:  Initial evaluation for epigastric pain, nausea vomiting, hematemesis.  EXAM: ACUTE ABDOMEN SERIES (ABDOMEN 2 VIEW & CHEST 1 VIEW)  COMPARISON:  Prior radiograph from 03/13/2011  FINDINGS: Mild cardiomegaly is stable from prior study. Atherosclerotic calcifications present within the aortic arch. Mediastinal silhouette otherwise unremarkable.  Lungs are normally inflated. No focal infiltrate, pulmonary edema, or pleural effusion. There is  no pneumothorax.  Large hiatal hernia with air-fluid level present.  ACDF overlies cervical spine.  Paucity of gas limits evaluation of the bowels. No evidence of obstruction or ileus. No free air. No soft tissue mass or abnormal calcifications. Cholecystectomy clips noted.  Mild dextroscoliosis of the lumbar spine with associated multilevel degenerative changes present. No acute osseus abnormality. Degenerative changes about the hips bilaterally is well.  IMPRESSION: 1. Large hiatal hernia.  Nonobstructive bowel gas pattern. 2. Stable cardiomegaly with no acute cardiopulmonary abnormality.   Electronically Signed   By: Rise Mu M.D.   On: 07/21/2014 03:09    Medications: Scheduled Meds: . amoxicillin-clavulanate  1 tablet Oral Q12H  . furosemide  20 mg Intravenous Once  . [START ON 07/24/2014] pantoprazole (PROTONIX) IV  40 mg Intravenous Q12H  . sodium chloride  3 mL Intravenous Q12H      LOS: 1 day   RAI,RIPUDEEP M.D. [facility] 07/22/2014, 12:07 PM Pager:  [phone]  If 7PM-7AM, please contact night-coverage [email or web address] Password [email or web address]

## 2014-07-22 NOTE — Progress Notes (Signed)
Patient ID: [name], female   DOB: [birth year], 80 y.o.   MRN: [id number] 1 Day Post-Op  Subjective: Pt feels well this morning.  No pain.  Tolerated her breakfast with no issues  Objective: Vital signs in last 24 hours: Temp:  [97.6 F (36.4 C)-99.5 F (37.5 C)] 98.7 F (37.1 C) (10/08 1059) Pulse Rate:  [61-87] 73 (10/08 1100) Resp:  [15-25] 22 (10/08 1100) BP: (102-127)/(35-90) 119/53 mmHg (10/08 1057) SpO2:  [87 %-98 %] 94 % (10/08 1100) Weight:  [153 lb 3.5 oz (69.5 kg)-156 lb 12 oz (71.1 kg)] 156 lb 12 oz (71.1 kg) (10/08 0432) Last BM Date: 07/20/14  Intake/Output from previous day: 10/07 0701 - 10/08 0700 In: 1170 [P.O.:120; I.V.:1050] Out: 350 [Urine:350] Intake/Output this shift: Total I/O In: 160 [I.V.:160] Out: 350 [Urine:350]  PE: Abd: soft, NT, ND, +BS  Lab Results:   Recent Labs  07/21/14 0223 07/21/14 1252  WBC 12.9* 17.0*  HGB 14.5 12.5  HCT 42.9 37.2  PLT 258 246   BMET  Recent Labs  07/21/14 0223  NA 141  K 4.1  CL 98  CO2 26  GLUCOSE 172*  BUN 15  CREATININE 0.73  CALCIUM 9.7   PT/INR  Recent Labs  07/21/14 0223  LABPROT 12.7  INR 0.95   CMP     Component Value Date/Time   NA 141 07/21/2014 0223   K 4.1 07/21/2014 0223   CL 98 07/21/2014 0223   CO2 26 07/21/2014 0223   GLUCOSE 172* 07/21/2014 0223   BUN 15 07/21/2014 0223   CREATININE 0.73 07/21/2014 0223   CALCIUM 9.7 07/21/2014 0223   PROT 8.3 07/21/2014 0223   ALBUMIN 4.1 07/21/2014 0223   AST 18 07/21/2014 0223   ALT 14 07/21/2014 0223   ALKPHOS 76 07/21/2014 0223   BILITOT 0.3 07/21/2014 0223   GFRNONAA 71* 07/21/2014 0223   GFRAA 82* 07/21/2014 0223   Lipase     Component Value Date/Time   LIPASE 18 07/21/2014 0223       Studies/Results: Dg Chest Port 1 View  07/22/2014   CLINICAL DATA:  Dyspnea with history of hypertension and heart murmur and reflux  EXAM: PORTABLE CHEST - 1 VIEW  COMPARISON:  Acute abdominal series of July 21, 2014  FINDINGS: The lungs  are well-expanded. The interstitial markings remain increased predominantly on the left in the mid and lower lung zone. The cardiopericardial silhouette top-normal in size. There is a large hiatal hernia-partially intrathoracic stomach. There is no pleural effusion or pneumothorax. There is mild tortuosity of the descending thoracic aorta. The bony thorax is unremarkable.  IMPRESSION: There remain increased interstitial markings predominantly on the left in the mid and lower lung consistent with interstitial edema. There is no alveolar pneumonia. A large hiatal hernia-partially intrathoracic stomach persists placing the patient at aspiration risk.   Electronically Signed   By: David  [place]   On: 07/22/2014 08:05   Dg Abd Acute W/chest  07/21/2014   CLINICAL DATA:  Initial evaluation for epigastric pain, nausea vomiting, hematemesis.  EXAM: ACUTE ABDOMEN SERIES (ABDOMEN 2 VIEW & CHEST 1 VIEW)  COMPARISON:  Prior radiograph from 03/13/2011  FINDINGS: Mild cardiomegaly is stable from prior study. Atherosclerotic calcifications present within the aortic arch. Mediastinal silhouette otherwise unremarkable.  Lungs are normally inflated. No focal infiltrate, pulmonary edema, or pleural effusion. There is no pneumothorax.  Large hiatal hernia with air-fluid level present.  ACDF overlies cervical spine.  Paucity of gas limits evaluation of  the bowels. No evidence of obstruction or ileus. No free air. No soft tissue mass or abnormal calcifications. Cholecystectomy clips noted.  Mild dextroscoliosis of the lumbar spine with associated multilevel degenerative changes present. No acute osseus abnormality. Degenerative changes about the hips bilaterally is well.  IMPRESSION: 1. Large hiatal hernia.  Nonobstructive bowel gas pattern. 2. Stable cardiomegaly with no acute cardiopulmonary abnormality.   Electronically Signed   By: Rise MuBenjamin  McClintock M.D.   On: 07/21/2014 03:09    Anti-infectives: Anti-infectives   Start      Dose/Rate Route Frequency Ordered Stop   07/22/14 1000  amoxicillin-clavulanate (AUGMENTIN) 875-125 MG per tablet 1 tablet     1 tablet Oral Every 12 hours 07/22/14 0738     07/21/14 0530  cefTRIAXone (ROCEPHIN) 1 g in dextrose 5 % 50 mL IVPB     1 g 100 mL/hr over 30 Minutes Intravenous  Once 07/21/14 0523 07/21/14 0714       Assessment/Plan  1. Hiatal hernia with partial volvulus  Plan: 1. Patient does not want any surgery, ever.  She is currently asymptomatic and tolerating a diet.  No surgical plans.  We will sign off.  LOS: 1 day    Markan Cazarez E 07/22/2014, 12:49 PM Pager: [phone]

## 2014-07-22 NOTE — Consult Note (Signed)
Seen and agree  

## 2014-07-22 NOTE — Progress Notes (Signed)
[name] 10:46 AM  Subjective: Patient doing much better tolerating clear liquid and not having any pain nausea or vomiting and no signs of further bleeding  Objective: Vital signs stable afebrile no acute distress abdomen is soft nontender labs reviewed  Assessment: Paraesophageal hernia with partial volvulus currently improved  Plan: I would continue clear liquids today and if doing well soft solids tomorrow and then hopefully home soon and continue pump inhibitors long-term and actually if this becomes a frequent recurrence I would favor surgical options since she is actually very functional for her age and her case was discussed with the patient and her daughter  [facility],[name]

## 2014-07-22 NOTE — Progress Notes (Signed)
Utilization review completed.  

## 2014-07-23 LAB — CBC
HCT: 31.7 % — ABNORMAL LOW (ref 36.0–46.0)
HEMOGLOBIN: 10.3 g/dL — AB (ref 12.0–15.0)
MCH: 29.9 pg (ref 26.0–34.0)
MCHC: 32.5 g/dL (ref 30.0–36.0)
MCV: 91.9 fL (ref 78.0–100.0)
Platelets: 220 10*3/uL (ref 150–400)
RBC: 3.45 MIL/uL — AB (ref 3.87–5.11)
RDW: 14.4 % (ref 11.5–15.5)
WBC: 14.5 10*3/uL — ABNORMAL HIGH (ref 4.0–10.5)

## 2014-07-23 LAB — BASIC METABOLIC PANEL
Anion gap: 12 (ref 5–15)
BUN: 9 mg/dL (ref 6–23)
CO2: 27 meq/L (ref 19–32)
Calcium: 7.6 mg/dL — ABNORMAL LOW (ref 8.4–10.5)
Chloride: 106 mEq/L (ref 96–112)
Creatinine, Ser: 0.85 mg/dL (ref 0.50–1.10)
GFR calc Af Amer: 66 mL/min — ABNORMAL LOW (ref >90)
GFR calc non Af Amer: 57 mL/min — ABNORMAL LOW (ref >90)
GLUCOSE: 92 mg/dL (ref 70–99)
Potassium: 3 mEq/L — ABNORMAL LOW (ref 3.7–5.3)
SODIUM: 145 meq/L (ref 137–147)

## 2014-07-23 MED ORDER — POTASSIUM CHLORIDE CRYS ER 20 MEQ PO TBCR
40.0000 meq | EXTENDED_RELEASE_TABLET | Freq: Once | ORAL | Status: AC
  Administered 2014-07-23: 40 meq via ORAL
  Filled 2014-07-23: qty 2

## 2014-07-23 MED ORDER — DOCUSATE SODIUM 100 MG PO CAPS
100.0000 mg | ORAL_CAPSULE | Freq: Two times a day (BID) | ORAL | Status: DC
  Administered 2014-07-23 (×2): 100 mg via ORAL
  Filled 2014-07-23 (×4): qty 1

## 2014-07-23 MED ORDER — POTASSIUM CHLORIDE ER 10 MEQ PO TBCR
20.0000 meq | EXTENDED_RELEASE_TABLET | Freq: Once | ORAL | Status: AC
  Administered 2014-07-23: 20 meq via ORAL
  Filled 2014-07-23: qty 2

## 2014-07-23 MED ORDER — LORATADINE 10 MG PO TABS
10.0000 mg | ORAL_TABLET | Freq: Every day | ORAL | Status: DC | PRN
  Filled 2014-07-23: qty 1

## 2014-07-23 MED ORDER — BISACODYL 10 MG RE SUPP
10.0000 mg | Freq: Once | RECTAL | Status: DC
  Filled 2014-07-23: qty 1

## 2014-07-23 MED ORDER — PANTOPRAZOLE SODIUM 40 MG PO TBEC
40.0000 mg | DELAYED_RELEASE_TABLET | Freq: Two times a day (BID) | ORAL | Status: DC
  Administered 2014-07-23 – 2014-07-26 (×6): 40 mg via ORAL
  Filled 2014-07-23 (×6): qty 1

## 2014-07-23 MED ORDER — POLYETHYLENE GLYCOL 3350 17 G PO PACK
17.0000 g | PACK | Freq: Every day | ORAL | Status: DC | PRN
  Filled 2014-07-23: qty 1

## 2014-07-23 MED ORDER — POTASSIUM CHLORIDE 10 MEQ/100ML IV SOLN
10.0000 meq | INTRAVENOUS | Status: DC
  Administered 2014-07-23: 10 meq via INTRAVENOUS

## 2014-07-23 NOTE — Progress Notes (Signed)
[place] to [id number]

## 2014-07-23 NOTE — Progress Notes (Signed)
Patient ID: [name]  female  ZOX:[id number]    DOB: [birth year]    DOA: 07/21/2014  PCP: Freddy JakschUTLAW,WILLIAM M, MD  Brief history of present illness 80 year old female with history of GERD, hiatal hernia, moderate aortic stenosis, hypertension presented with coffee-ground emesis. GI has been consulted. Patient underwent EGD which showed large hiatal hernia with probable partial volvulus. Patient has been seen by surgery and recommendation nonoperative management.    Assessment/Plan: Principal Problem:   Hematemesis with Paraesophageal hernia with partial volvulus; improving - GI following, patient underwent EGD which showed large hiatal hernia with probable partial volvulus - General surgery was consulted and felt that this is likely long-standing issue and she is not a candidate for surgery. If unable to eat, consider J-tube for enteral access however patient is not interested. - Advance to soft solids today - Continue PPI  Active Problems: Acute hypoxic respiratory failure - Improved likely due to fluid overload, was given Lasix 20 mg IV  acute laryngitis- improving - Placed on Cepacol and Augmentin    Aortic stenosis - Discontinued IV fluids    HTN (hypertension): Currently borderline hypotension - Hold antihypertensives  Leukocytosis: Improving - No fevers or chills, no UTI, has acute laryngitis, placed on Augmentin  -  No pneumonia on chest x-ray  Hypokalemia: Likely due to Lasix - Replaced  UTI/pyuria: - Urine culture showed more than 100,000 colonies of multiple bacterial morphotypes, UA yesterday was negative for UTI  DVT Prophylaxis:SCDs   Code Status:DO NOT RESUSCITATE   Family Communication: Called patient's daughter, Ms. Yvone Neuatton however unable to reach her  Disposition: Transfer to MedSurg floor, hopefully DC in AM  Consultants: Gastroenterology Surgery  Procedures: ENDOSCOPIC IMPRESSION:  1 large hiatal hernia with probable partial volvulusand  over 500 cc  of dark fluid suctioned from the stomach but with some food  particles that frequently clogged the scope 2. very difficult to  advance past the twisted distal stomachbut no signs of active  bleeding in exam to the second portion of the duodenum 3 minimal  oozing seen at GE junction secondary to partial twist and probably  scope trauma but no active bleeding at the end of the procedure    Antibiotics:  Augmentin    Subjective: Patient seen and examined, feeling a lot better, does not like liquids Objective: Weight change:   Intake/Output Summary (Last 24 hours) at 07/23/14 1021 Last data filed at 07/23/14 0955  Gross per 24 hour  Intake   1220 ml  Output   2285 ml  Net  -1065 ml   Blood pressure 111/71, pulse 85, temperature 98.4 F (36.9 C), temperature source Oral, resp. rate 17, height 5\' 3"  (1.6 m), weight 71.1 kg (156 lb 12 oz), SpO2 93.00%.  Physical Exam: General: Alert and awake, oriented x3, NAD CVS: S1-S2 clear, no murmur rubs or gallops Chest: CTAB Abdomen: soft nontender, nondistended, normal bowel sounds  Extremities: no cyanosis, clubbing or edema noted bilaterally   Lab Results: Basic Metabolic Panel:  Recent Labs Lab 07/21/14 0223 07/23/14 0241  NA 141 145  K 4.1 3.0*  CL 98 106  CO2 26 27  GLUCOSE 172* 92  BUN 15 9  CREATININE 0.73 0.85  CALCIUM 9.7 7.6*   Liver Function Tests:  Recent Labs Lab 07/21/14 0223  AST 18  ALT 14  ALKPHOS 76  BILITOT 0.3  PROT 8.3  ALBUMIN 4.1    Recent Labs Lab 07/21/14 0223  LIPASE 18   No results found for  this basename: AMMONIA,  in the last 168 hours CBC:  Recent Labs Lab 07/21/14 0223 07/21/14 1252 07/23/14 0241  WBC 12.9* 17.0* 14.5*  NEUTROABS 11.5*  --   --   HGB 14.5 12.5 10.3*  HCT 42.9 37.2 31.7*  MCV 91.1 92.1 91.9  PLT 258 246 220   Cardiac Enzymes:  Recent Labs Lab 07/21/14 0223  TROPONINI <0.30   BNP: No components found with this basename: POCBNP,    CBG: No results found for this basename: GLUCAP,  in the last 168 hours   Micro Results: Recent Results (from the past 240 hour(s))  URINE CULTURE     Status: None   Collection Time    07/21/14  3:35 AM      Result Value Ref Range Status   Specimen Description URINE, CLEAN CATCH   Final   Special Requests ADDED [id number] [phone]   Final   Culture  Setup Time     Final   Value: 07/21/2014 11:30     Performed at [facility] Count     Final   Value: >=100,000 COLONIES/ML     Performed at [facility]   Culture     Final   Value: Multiple bacterial morphotypes present, none predominant. Suggest appropriate recollection if clinically indicated.     Performed at [facility]   Report Status 07/22/2014 FINAL   Final  MRSA PCR SCREENING     Status: None   Collection Time    07/21/14  8:51 PM      Result Value Ref Range Status   MRSA by PCR NEGATIVE  NEGATIVE Final   Comment:            The GeneXpert MRSA Assay (FDA     approved for NASAL specimens     only), is one component of a     comprehensive MRSA colonization     surveillance program. It is not     intended to diagnose MRSA     infection nor to guide or     monitor treatment for     MRSA infections.    Studies/Results: Dg Chest Port 1 View  07/22/2014   CLINICAL DATA:  Dyspnea with history of hypertension and heart murmur and reflux  EXAM: PORTABLE CHEST - 1 VIEW  COMPARISON:  Acute abdominal series of July 21, 2014  FINDINGS: The lungs are well-expanded. The interstitial markings remain increased predominantly on the left in the mid and lower lung zone. The cardiopericardial silhouette top-normal in size. There is a large hiatal hernia-partially intrathoracic stomach. There is no pleural effusion or pneumothorax. There is mild tortuosity of the descending thoracic aorta. The bony thorax is unremarkable.  IMPRESSION: There remain increased interstitial markings predominantly on the left in the  mid and lower lung consistent with interstitial edema. There is no alveolar pneumonia. A large hiatal hernia-partially intrathoracic stomach persists placing the patient at aspiration risk.   Electronically Signed   By: David  [place]   On: 07/22/2014 08:05   Dg Abd Acute W/chest  07/21/2014   CLINICAL DATA:  Initial evaluation for epigastric pain, nausea vomiting, hematemesis.  EXAM: ACUTE ABDOMEN SERIES (ABDOMEN 2 VIEW & CHEST 1 VIEW)  COMPARISON:  Prior radiograph from 03/13/2011  FINDINGS: Mild cardiomegaly is stable from prior study. Atherosclerotic calcifications present within the aortic arch. Mediastinal silhouette otherwise unremarkable.  Lungs are normally inflated. No focal infiltrate, pulmonary edema, or pleural effusion. There is no pneumothorax.  Large hiatal hernia with air-fluid level present.  ACDF overlies cervical spine.  Paucity of gas limits evaluation of the bowels. No evidence of obstruction or ileus. No free air. No soft tissue mass or abnormal calcifications. Cholecystectomy clips noted.  Mild dextroscoliosis of the lumbar spine with associated multilevel degenerative changes present. No acute osseus abnormality. Degenerative changes about the hips bilaterally is well.  IMPRESSION: 1. Large hiatal hernia.  Nonobstructive bowel gas pattern. 2. Stable cardiomegaly with no acute cardiopulmonary abnormality.   Electronically Signed   By: Rise MuBenjamin  McClintock M.D.   On: 07/21/2014 03:09    Medications: Scheduled Meds: . amoxicillin-clavulanate  1 tablet Oral Q12H  . bisacodyl  10 mg Rectal Once  . docusate sodium  100 mg Oral BID  . pantoprazole  40 mg Oral BID AC  . potassium chloride  20 mEq Oral Once  . sodium chloride  3 mL Intravenous Q12H      LOS: 2 days   RAI,RIPUDEEP M.D. [facility] 07/23/2014, 10:21 AM Pager: [phone]  If 7PM-7AM, please contact night-coverage [email or web address] Password [email or web address]

## 2014-07-23 NOTE — Progress Notes (Signed)
Pt Sp02 on room air at rest 94%. Pt ambulated and Sp02 90% on room air. Pt tolerated ambulation well with not complaints of shortness of breath or distress. Pt encouraged to ambulated to regain strength.

## 2014-07-23 NOTE — Progress Notes (Signed)
[name] 8:59 AM  Subjective: Patient doing better without any complaints and looking forward to eating breakfast Objective: Vital signs stable afebrile no acute distress abdomen is soft nontender labs reviewed  Assessment: Partial gastric volvulus improved  Plan: As long as she tolerates soft solid she can go home soon and I'm happy to see back in the office in a few weeks and please call me sooner if needed or my partner Dr. Madilyn FiremanHayes this weekend if any question or problem  [facility],[name] E

## 2014-07-23 NOTE — Progress Notes (Signed)
NURSING PROGRESS NOTE  [name] [id number] Transfer Data: 07/23/2014 3:11 PM Attending Provider: Cathren Harshipudeep K Rai, MD WUJ:[id number],NWGNFAOPCP:OUTLAW,WILLIAM M, MD Code Status: DNR  [name] is a 80 y.o. female patient transferred from [facility] -No acute distress noted.  -No complaints of shortness of breath.  -No complaints of chest pain.   Cardiac Monitoring: Box # 19 in place.   Blood pressure 112/50, pulse 88, temperature 98.4 F (36.9 C), temperature source Oral, resp. rate 18, height 5\' 3"  (1.6 m), weight 71.1 kg (156 lb 12 oz), SpO2 91.00%.   IV Fluids:  IV in place, occlusive dsg intact without redness, IV cath   Allergies:  Codeine  Past Medical History:   has a past medical history of GERD (gastroesophageal reflux disease); Hyperlipidemia; Moderate aortic stenosis; HTN (hypertension); Heart murmur; and Hernia, epigastric.  Past Surgical History:   has past surgical history that includes appendectomy; cataract surgery; Cholecystectomy; Anterior cervical decomp/discectomy fusion; and Esophagogastroduodenoscopy (N/A, 07/21/2014).  Social History:   reports that she has never smoked. She does not have any smokeless tobacco history on file. She reports that she does not drink alcohol.  Skin: abrasion on right thigh  Patient/Family orientated to room. Information packet given to patient/family. Admission inpatient armband information verified with patient/family to include name and date of birth and placed on patient arm. Side rails up x 2, fall assessment and education completed with patient/family. Patient/family able to verbalize understanding of risk associated with falls and verbalized understanding to call for assistance before getting out of bed. Call light within reach. Patient/family able to voice and demonstrate understanding of unit orientation instructions.    Will continue to evaluate and treat per MD orders.

## 2014-07-24 ENCOUNTER — Inpatient Hospital Stay (HOSPITAL_COMMUNITY): Payer: Medicare Other

## 2014-07-24 DIAGNOSIS — R52 Pain, unspecified: Secondary | ICD-10-CM

## 2014-07-24 DIAGNOSIS — J040 Acute laryngitis: Secondary | ICD-10-CM

## 2014-07-24 LAB — BASIC METABOLIC PANEL
Anion gap: 12 (ref 5–15)
BUN: 7 mg/dL (ref 6–23)
CHLORIDE: 104 meq/L (ref 96–112)
CO2: 26 mEq/L (ref 19–32)
Calcium: 8.1 mg/dL — ABNORMAL LOW (ref 8.4–10.5)
Creatinine, Ser: 0.8 mg/dL (ref 0.50–1.10)
GFR calc non Af Amer: 61 mL/min — ABNORMAL LOW (ref >90)
GFR, EST AFRICAN AMERICAN: 71 mL/min — AB (ref >90)
GLUCOSE: 104 mg/dL — AB (ref 70–99)
Potassium: 3.3 mEq/L — ABNORMAL LOW (ref 3.7–5.3)
Sodium: 142 mEq/L (ref 137–147)

## 2014-07-24 LAB — C-REACTIVE PROTEIN: CRP: 9.6 mg/dL — ABNORMAL HIGH (ref <0.60)

## 2014-07-24 LAB — SEDIMENTATION RATE: Sed Rate: 90 mm/hr — ABNORMAL HIGH (ref 0–22)

## 2014-07-24 LAB — CBC
HEMATOCRIT: 32.4 % — AB (ref 36.0–46.0)
Hemoglobin: 10.8 g/dL — ABNORMAL LOW (ref 12.0–15.0)
MCH: 30.4 pg (ref 26.0–34.0)
MCHC: 33.3 g/dL (ref 30.0–36.0)
MCV: 91.3 fL (ref 78.0–100.0)
Platelets: 247 10*3/uL (ref 150–400)
RBC: 3.55 MIL/uL — ABNORMAL LOW (ref 3.87–5.11)
RDW: 14.2 % (ref 11.5–15.5)
WBC: 14.2 10*3/uL — AB (ref 4.0–10.5)

## 2014-07-24 LAB — URIC ACID: URIC ACID, SERUM: 6.9 mg/dL (ref 2.4–7.0)

## 2014-07-24 MED ORDER — POTASSIUM CHLORIDE CRYS ER 20 MEQ PO TBCR
40.0000 meq | EXTENDED_RELEASE_TABLET | Freq: Once | ORAL | Status: AC
  Administered 2014-07-24: 40 meq via ORAL
  Filled 2014-07-24: qty 2

## 2014-07-24 MED ORDER — AMOXICILLIN-POT CLAVULANATE 875-125 MG PO TABS: 1.0000 | ORAL_TABLET | Freq: Two times a day (BID) | ORAL | Status: DC

## 2014-07-24 MED ORDER — TRAMADOL HCL 50 MG PO TABS: 50.0000 mg | ORAL_TABLET | Freq: Three times a day (TID) | ORAL | Status: DC | PRN

## 2014-07-24 MED ORDER — TRAMADOL HCL 50 MG PO TABS
50.0000 mg | ORAL_TABLET | Freq: Three times a day (TID) | ORAL | Status: DC | PRN
  Administered 2014-07-24: 50 mg via ORAL
  Filled 2014-07-24: qty 1

## 2014-07-24 MED ORDER — PANTOPRAZOLE SODIUM 40 MG PO TBEC: 40.0000 mg | DELAYED_RELEASE_TABLET | Freq: Two times a day (BID) | ORAL | Status: DC

## 2014-07-24 MED ORDER — DOCUSATE SODIUM 100 MG PO CAPS: 100.0000 mg | ORAL_CAPSULE | Freq: Two times a day (BID) | ORAL | Status: DC | PRN

## 2014-07-24 MED ORDER — TRAMADOL HCL 50 MG PO TABS: 50.0000 mg | ORAL_TABLET | Freq: Four times a day (QID) | ORAL | Status: DC | PRN

## 2014-07-24 MED ORDER — PREDNISONE 50 MG PO TABS
60.0000 mg | ORAL_TABLET | Freq: Once | ORAL | Status: AC
  Administered 2014-07-24: 60 mg via ORAL
  Filled 2014-07-24: qty 1

## 2014-07-24 MED ORDER — PREDNISONE 50 MG PO TABS
60.0000 mg | ORAL_TABLET | Freq: Every day | ORAL | Status: AC
  Administered 2014-07-25: 60 mg via ORAL
  Filled 2014-07-24: qty 1

## 2014-07-24 MED ORDER — DSS 100 MG PO CAPS: 100.0000 mg | ORAL_CAPSULE | Freq: Two times a day (BID) | ORAL | Status: DC | PRN

## 2014-07-24 NOTE — Progress Notes (Signed)
NP Claiborne Billingsallahan notified concerning patient's b/p this evening. Awaiting any further orders

## 2014-07-24 NOTE — Progress Notes (Signed)
Patient ID: [name]  female  TML:[id number]    DOB: [birth year]    DOA: 07/21/2014  PCP: Landry Dyke, MD  Brief history of present illness 80 year old female with history of GERD, hiatal hernia, moderate aortic stenosis, hypertension presented with coffee-ground emesis. GI has been consulted. Patient underwent EGD which showed large hiatal hernia with probable partial volvulus. Patient has been seen by surgery and recommendation nonoperative management.    Assessment/Plan: Principal Problem:   Hematemesis with Paraesophageal hernia with partial volvulus; improving - GI was consulted and patient underwent EGD which showed large hiatal hernia with probable partial volvulus - General surgery was consulted and felt that this is likely long-standing issue and she is not a candidate for surgery. If unable to eat, consider J-tube for enteral access however patient is not interested. - Currently tolerating soft solids, continue PPI  Active Problems: Right foot pain - Ordered ESR, elevated and 90, uric acid normal, CRP pending - X-ray of the right foot ordered - Patient unable to bear weight on her right foot due to pain, unable to work with PT - Possibly gout, no cellulitis, possibly not a fracture as this is acute issue this morning - Cannot give NSAIDs due to recent hematemesis/GI bleed, will place on prednisone and tramadol, reassess in a.m.  Acute hypoxic respiratory failure - Improved likely due to fluid overload, was given Lasix 20 mg IV  acute laryngitis- improving - Placed on Cepacol and Augmentin    Aortic stenosis - Discontinued IV fluids    HTN (hypertension): Currently borderline hypotension - Hold antihypertensives  Leukocytosis: Improving - No fevers or chills, no UTI, has acute laryngitis, placed on Augmentin  -  No pneumonia on chest x-ray  Hypokalemia: Likely due to Lasix - Replaced  UTI/pyuria: - Urine culture showed more than 100,000 colonies of  multiple bacterial morphotypes, UA yesterday was negative for UTI  DVT Prophylaxis:SCDs   Code Status:DO NOT RESUSCITATE   Family Communication:   Disposition: Hopefully DC in am  Consultants: Gastroenterology Surgery  Procedures: ENDOSCOPIC IMPRESSION:  1 large hiatal hernia with probable partial volvulusand over 500 cc  of dark fluid suctioned from the stomach but with some food  particles that frequently clogged the scope 2. very difficult to  advance past the twisted distal stomachbut no signs of active  bleeding in exam to the second portion of the duodenum 3 minimal  oozing seen at GE junction secondary to partial twist and probably  scope trauma but no active bleeding at the end of the procedure    Antibiotics:  Augmentin    Subjective: Patient seen and examined, tolerating solid diet however complaining of right foot pain since last night. Describes a sharp and throbbing unable to bear weight.   Objective: Weight change:   Intake/Output Summary (Last 24 hours) at 07/24/14 1131 Last data filed at 07/24/14 1002  Gross per 24 hour  Intake    580 ml  Output      0 ml  Net    580 ml   Blood pressure 147/56, pulse 72, temperature 98.4 F (36.9 C), temperature source Oral, resp. rate 18, height 5' 3"  (1.6 m), weight 70.9 kg (156 lb 4.9 oz), SpO2 93.00%.  Physical Exam: General: Alert and awake, oriented x3, NAD CVS: S1-S2 clear, no murmur rubs or gallops Chest: CTAB Abdomen: soft nontender, nondistended, normal bowel sounds  Extremities: no cyanosis, clubbing or edema noted bilaterally right foot examined, no cellulitis or focal swelling   Lab  Results: Basic Metabolic Panel:  Recent Labs Lab 07/23/14 0241 07/24/14 0340  NA 145 142  K 3.0* 3.3*  CL 106 104  CO2 27 26  GLUCOSE 92 104*  BUN 9 7  CREATININE 0.85 0.80  CALCIUM 7.6* 8.1*   Liver Function Tests:  Recent Labs Lab 07/21/14 0223  AST 18  ALT 14  ALKPHOS 76  BILITOT 0.3  PROT  8.3  ALBUMIN 4.1    Recent Labs Lab 07/21/14 0223  LIPASE 18   No results found for this basename: AMMONIA,  in the last 168 hours CBC:  Recent Labs Lab 07/21/14 0223  07/23/14 0241 07/24/14 0340  WBC 12.9*  < > 14.5* 14.2*  NEUTROABS 11.5*  --   --   --   HGB 14.5  < > 10.3* 10.8*  HCT 42.9  < > 31.7* 32.4*  MCV 91.1  < > 91.9 91.3  PLT 258  < > 220 247  < > = values in this interval not displayed. Cardiac Enzymes:  Recent Labs Lab 07/21/14 0223  TROPONINI <0.30   BNP: No components found with this basename: POCBNP,  CBG: No results found for this basename: GLUCAP,  in the last 168 hours   Micro Results: Recent Results (from the past 240 hour(s))  URINE CULTURE     Status: None   Collection Time    07/21/14  3:35 AM      Result Value Ref Range Status   Specimen Description URINE, CLEAN CATCH   Final   Special Requests ADDED [id number] [phone]   Final   Culture  Setup Time     Final   Value: 07/21/2014 11:30     Performed at [place]     Final   Value: >=100,000 COLONIES/ML     Performed at [facility]   Culture     Final   Value: Multiple bacterial morphotypes present, none predominant. Suggest appropriate recollection if clinically indicated.     Performed at [facility]   Report Status 07/22/2014 FINAL   Final  MRSA PCR SCREENING     Status: None   Collection Time    07/21/14  8:51 PM      Result Value Ref Range Status   MRSA by PCR NEGATIVE  NEGATIVE Final   Comment:            The GeneXpert MRSA Assay (FDA     approved for NASAL specimens     only), is one component of a     comprehensive MRSA colonization     surveillance program. It is not     intended to diagnose MRSA     infection nor to guide or     monitor treatment for     MRSA infections.    Studies/Results: Dg Chest Port 1 View  07/22/2014   CLINICAL DATA:  Dyspnea with history of hypertension and heart murmur and reflux  EXAM: PORTABLE CHEST  - 1 VIEW  COMPARISON:  Acute abdominal series of July 21, 2014  FINDINGS: The lungs are well-expanded. The interstitial markings remain increased predominantly on the left in the mid and lower lung zone. The cardiopericardial silhouette top-normal in size. There is a large hiatal hernia-partially intrathoracic stomach. There is no pleural effusion or pneumothorax. There is mild tortuosity of the descending thoracic aorta. The bony thorax is unremarkable.  IMPRESSION: There remain increased interstitial markings predominantly on the left in the mid and lower  lung consistent with interstitial edema. There is no alveolar pneumonia. A large hiatal hernia-partially intrathoracic stomach persists placing the patient at aspiration risk.   Electronically Signed   By: David  Martinique   On: 07/22/2014 08:05   Dg Abd Acute W/chest  07/21/2014   CLINICAL DATA:  Initial evaluation for epigastric pain, nausea vomiting, hematemesis.  EXAM: ACUTE ABDOMEN SERIES (ABDOMEN 2 VIEW & CHEST 1 VIEW)  COMPARISON:  Prior radiograph from 03/13/2011  FINDINGS: Mild cardiomegaly is stable from prior study. Atherosclerotic calcifications present within the aortic arch. Mediastinal silhouette otherwise unremarkable.  Lungs are normally inflated. No focal infiltrate, pulmonary edema, or pleural effusion. There is no pneumothorax.  Large hiatal hernia with air-fluid level present.  ACDF overlies cervical spine.  Paucity of gas limits evaluation of the bowels. No evidence of obstruction or ileus. No free air. No soft tissue mass or abnormal calcifications. Cholecystectomy clips noted.  Mild dextroscoliosis of the lumbar spine with associated multilevel degenerative changes present. No acute osseus abnormality. Degenerative changes about the hips bilaterally is well.  IMPRESSION: 1. Large hiatal hernia.  Nonobstructive bowel gas pattern. 2. Stable cardiomegaly with no acute cardiopulmonary abnormality.   Electronically Signed   By: Jeannine Boga M.D.   On: 07/21/2014 03:09    Medications: Scheduled Meds: . amoxicillin-clavulanate  1 tablet Oral Q12H  . bisacodyl  10 mg Rectal Once  . pantoprazole  40 mg Oral BID AC  . sodium chloride  3 mL Intravenous Q12H      LOS: 3 days   Ofilia Rayon M.D. [facility] 07/24/2014, 11:31 AM Pager: [phone]  If 7PM-7AM, please contact night-coverage [email or web address] Password [email or web address]

## 2014-07-24 NOTE — Progress Notes (Signed)
Patient complaining of right foot pain (especially in her toes) even after she received pain medication. Unable to work with PT. MD was notified and new orders was given. Pt will not be D/C home  today; will continue to monitor.

## 2014-07-24 NOTE — Evaluation (Signed)
Physical Therapy Evaluation Patient Details Name: [name] MRN: [id number] DOB: 1917-12-09 Today's Date: 07/24/2014   History of Present Illness  Patient is a 80 yo female admitted 07/21/14 with hematemesis.  Patient had an episode of resp distress due to volume overload - resolved.  Today patient c/o painful Rt foot.  RN notified.  PMH: GERD, aortic stenosis, HTN, hiatal hernia.  Clinical Impression  Patient presents with general weakness and pain in Rt foot that impacts functional mobility.  Patient reports pain started yesterday.  Patient unable to ambulate due to this pain.  RN to room to look at foot - contacting MD. Patient lives alone.  Daughter can stay with her until Tuesday (she will then be out of town per patient).  Patient declined SNF.  Recommend HHPT and HH aide to assist with ADL's at discharge.    Follow Up Recommendations Home health PT;Supervision/Assistance - 24 hour [facility](Home Health Aide.  Patient refusing SNF)    Equipment Recommendations  None recommended by PT    Recommendations for Other Services       Precautions / Restrictions Precautions Precautions: Fall Restrictions Weight Bearing Restrictions: No      Mobility  Bed Mobility Overal bed mobility: Needs Assistance Bed Mobility: Sit to Supine       Sit to supine: Min assist   General bed mobility comments: Min assist to move LE's onto bed to return to supine.  [facility]  Transfers Overall transfer level: Needs assistance Equipment used: Rolling walker (2 wheeled) Transfers: Sit to/from [employer] CorporationStand;Stand Pivot Transfers Sit to Stand: Min assist Stand pivot transfers: Min assist       General transfer comment: Verbal cues for hand placement.  Patient stood for pericare following BM.  After rest, patient stood to attempt ambulation.  Assist to rise to standing.  Patient able to take 1-2 steps and returned to sitting due to Rt foot pain.  Patient declined further ambulation due to  pain.  Min assist to transfer back [facility].  Ambulation/Gait             General Gait Details: Unable due to pain in Rt foot  Stairs            Wheelchair Mobility    Modified Rankin (Stroke Patients Only)       Balance Overall balance assessment: Needs assistance         Standing balance support: Bilateral upper extremity supported Standing balance-Leahy Scale: Poor Standing balance comment: Required use of UE support to maintain balance.                             Pertinent Vitals/Pain Pain Assessment: 0-10 Pain Score: 6  Pain Location: Rt foot Pain Descriptors / Indicators: Sharp;Stabbing Pain Intervention(s): Limited activity within patient's tolerance;Patient requesting pain meds-RN notified (RN in to look at foot.  Contacting MD)    Home Living Family/patient expects to be discharged to:: Private residence Living Arrangements: Alone Available Help at Discharge: Family (Daughter to stay with patient x3 days.  Then out of town) Type of Home: House Home Access: Stairs to enter Entrance Stairs-Rails: Doctor, general practiceight;Left Entrance Stairs-Number of Steps: 4 Home Layout: One level Home Equipment: Environmental consultantWalker - 2 wheels;Cane - single point;Bedside commode;Shower seat - built in;Wheelchair - manual      Prior Function Level of Independence: Independent         Comments: Was driving pta.  Has  housekeeper for heavy house cleaning.     Hand Dominance   Dominant Hand: Right    Extremity/Trunk Assessment   Upper Extremity Assessment: Generalized weakness           Lower Extremity Assessment: Generalized weakness RLE Deficits / Details: Patient c/o pain with movement of Rt foot/toes.  Painful with attempts to weight bear on foot.  Noted area on plantar surface slightly reddened and tender.  Rt lateral ankle with edema.  Dorsum of foot also tender to touch.  Rt great toe slightly reddened.    Cervical / Trunk Assessment: Kyphotic   Communication   Communication: No difficulties  Cognition Arousal/Alertness: Awake/alert Behavior During Therapy: WFL for tasks assessed/performed Overall Cognitive Status: Within Functional Limits for tasks assessed                      General Comments General comments (skin integrity, edema, etc.): As above, edema and reddness noted Rt foot/ankle.    Exercises        Assessment/Plan    PT Assessment Patient needs continued PT services  PT Diagnosis Difficulty walking;Generalized weakness;Acute pain   PT Problem List Decreased strength;Decreased activity tolerance;Decreased balance;Decreased mobility;Decreased knowledge of use of DME;Pain  PT Treatment Interventions DME instruction;Gait training;Stair training;Functional mobility training;Therapeutic activities;Therapeutic exercise;Patient/family education   PT Goals (Current goals can be found in the Care Plan section) Acute Rehab PT Goals Patient Stated Goal: To stop pain in Rt foot PT Goal Formulation: With patient Time For Goal Achievement: 07/24/2014 Potential to Achieve Goals: Good    Frequency Min 3X/week   Barriers to discharge Decreased caregiver support;Inaccessible home environment Patient lives alone.  Daughter can stay with patient for 3 days (until Tuesday).  Then will be out of town per patient.  Patient also has 4 steps to enter home.    Co-evaluation               End of Session Equipment Utilized During Treatment: Gait belt Activity Tolerance: Patient limited by pain Patient left: in bed;with call bell/phone within reach;with bed alarm set Nurse Communication: Mobility status (Pain in Rt foot)         Time: [id number] PT Time Calculation (min): 40 min   Charges:   PT Evaluation $Initial PT Evaluation Tier I: 1 Procedure PT Treatments $Therapeutic Activity: 23-37 mins   PT G Codes:          Vena AustriaDavis, Lorien Shingler H 07/24/2014, 10:45 AM Durenda HurtSusan H. Renaldo Fiddleravis, PT, [facility] Acute Rehab  Services Pager [phone]

## 2014-07-25 DIAGNOSIS — M79672 Pain in left foot: Secondary | ICD-10-CM

## 2014-07-25 DIAGNOSIS — M79671 Pain in right foot: Secondary | ICD-10-CM | POA: Diagnosis present

## 2014-07-25 LAB — CBC
HEMATOCRIT: 32.4 % — AB (ref 36.0–46.0)
Hemoglobin: 10.8 g/dL — ABNORMAL LOW (ref 12.0–15.0)
MCH: 30.3 pg (ref 26.0–34.0)
MCHC: 33.3 g/dL (ref 30.0–36.0)
MCV: 90.8 fL (ref 78.0–100.0)
Platelets: 263 10*3/uL (ref 150–400)
RBC: 3.57 MIL/uL — AB (ref 3.87–5.11)
RDW: 14 % (ref 11.5–15.5)
WBC: 13.6 10*3/uL — AB (ref 4.0–10.5)

## 2014-07-25 LAB — BASIC METABOLIC PANEL
Anion gap: 13 (ref 5–15)
BUN: 8 mg/dL (ref 6–23)
CALCIUM: 8.3 mg/dL — AB (ref 8.4–10.5)
CHLORIDE: 105 meq/L (ref 96–112)
CO2: 23 meq/L (ref 19–32)
Creatinine, Ser: 0.72 mg/dL (ref 0.50–1.10)
GFR calc Af Amer: 83 mL/min — ABNORMAL LOW (ref >90)
GFR calc non Af Amer: 71 mL/min — ABNORMAL LOW (ref >90)
GLUCOSE: 111 mg/dL — AB (ref 70–99)
Potassium: 3.8 mEq/L (ref 3.7–5.3)
Sodium: 141 mEq/L (ref 137–147)

## 2014-07-25 MED ORDER — PREDNISONE 20 MG PO TABS
40.0000 mg | ORAL_TABLET | Freq: Every day | ORAL | Status: DC
  Administered 2014-07-26: 40 mg via ORAL
  Filled 2014-07-25 (×2): qty 2

## 2014-07-25 MED ORDER — PREDNISONE 20 MG PO TABS
20.0000 mg | ORAL_TABLET | Freq: Once | ORAL | Status: AC
  Administered 2014-07-25: 20 mg via ORAL
  Filled 2014-07-25: qty 1

## 2014-07-25 MED ORDER — PREGABALIN 75 MG PO CAPS
75.0000 mg | ORAL_CAPSULE | Freq: Two times a day (BID) | ORAL | Status: DC
  Administered 2014-07-25 – 2014-07-26 (×3): 75 mg via ORAL
  Filled 2014-07-25 (×3): qty 1

## 2014-07-25 NOTE — Progress Notes (Addendum)
Patient ID: [name]  female  VAN:[id number]    DOB: Jun 03, 1918    DOA: 07/21/2014  PCP: Landry Dyke, MD  Brief history of present illness 80 year old female with history of GERD, hiatal hernia, moderate aortic stenosis, hypertension presented with coffee-ground emesis. GI has been consulted. Patient underwent EGD which showed large hiatal hernia with probable partial volvulus. Patient has been seen by surgery and recommendation nonoperative management.    Assessment/Plan: Principal Problem:   Hematemesis with Paraesophageal hernia with partial volvulus; improving - GI was consulted and patient underwent EGD which showed large hiatal hernia with probable partial volvulus - General surgery was consulted and felt that this is likely long-standing issue and she is not a candidate for surgery. If unable to eat, consider J-tube for enteral access however patient is not interested. - Currently tolerating soft solids, continue PPI  Active Problems: Right foot pain and now left foot pain today: Unclear etiology possibly plantar fasciitis versus gout vs neuropathic - ESR 90, CRP 9.6, uric acid normal  - X-ray of the right foot neg - Patient unable to bear weight on her feet due to pain, unable to work with PT - Cannot give NSAIDs due to recent hematemesis/GI bleed, placed on prednisone and tramadol but no improvement, orthopedics consulted for recommendations/opinion - Will try Lyrica   Acute hypoxic respiratory failure - Improved likely due to fluid overload, was given Lasix 20 mg IV  acute laryngitis- after EGD - Placed on Cepacol and Augmentin, resolved, discontinued Augmentin    Aortic stenosis - Discontinued IV fluids    HTN (hypertension): Currently borderline hypotension - Hold antihypertensives  Leukocytosis: Improving - No fevers or chills, no UTI, has acute laryngitis, placed on Augmentin  -  No pneumonia on chest x-ray  Hypokalemia: Likely due to Lasix -  Replaced  UTI/pyuria: - Urine culture showed more than 100,000 colonies of multiple bacterial morphotypes, UA yesterday was negative for UTI  DVT Prophylaxis:SCDs   Code Status:DO NOT RESUSCITATE   Family Communication:   Disposition:   Consultants: Gastroenterology Surgery  Procedures: ENDOSCOPIC IMPRESSION:  1 large hiatal hernia with probable partial volvulusand over 500 cc  of dark fluid suctioned from the stomach but with some food  particles that frequently clogged the scope 2. very difficult to  advance past the twisted distal stomachbut no signs of active  bleeding in exam to the second portion of the duodenum 3 minimal  oozing seen at GE junction secondary to partial twist and probably  scope trauma but no active bleeding at the end of the procedure    Antibiotics:  Augmentin    Subjective: Patient seen and examined, tolerating solid diet. Continues to complain of right foot pain and since last night left foot pain. Could not work with physical therapy again due to pain in both feet   Objective: Weight change: 0 kg (0 lb)  Intake/Output Summary (Last 24 hours) at 07/25/14 1020 Last data filed at 07/25/14 0940  Gross per 24 hour  Intake    100 ml  Output      0 ml  Net    100 ml   Blood pressure 145/57, pulse 80, temperature 98.3 F (36.8 C), temperature source Oral, resp. rate 18, height _0  (1.6 m), weight 70.9 kg (156 lb 4.9 oz), SpO2 94.00%.  Physical Exam: General: Alert and awake, oriented x3, NAD CVS: S1-S2 clear, no murmur rubs or gallops Chest: CTAB Abdomen: soft nontender, nondistended, normal bowel sounds  Extremities: no  c/c/e bilaterally, both of the feet examined, no cellulitis or focal swelling   Lab Results: Basic Metabolic Panel:  Recent Labs Lab 07/24/14 0340 07/25/14 0530  NA 142 141  K 3.3* 3.8  CL 104 105  CO2 26 23  GLUCOSE 104* 111*  BUN 7 8  CREATININE 0.80 0.72  CALCIUM 8.1* 8.3*   Liver Function  Tests:  Recent Labs Lab 07/21/14 0223  AST 18  ALT 14  ALKPHOS 76  BILITOT 0.3  PROT 8.3  ALBUMIN 4.1    Recent Labs Lab 07/21/14 0223  LIPASE 18   No results found for this basename: AMMONIA,  in the last 168 hours CBC:  Recent Labs Lab 07/21/14 0223  07/24/14 0340 07/25/14 0530  WBC 12.9*  < > 14.2* 13.6*  NEUTROABS 11.5*  --   --   --   HGB 14.5  < > 10.8* 10.8*  HCT 42.9  < > 32.4* 32.4*  MCV 91.1  < > 91.3 90.8  PLT 258  < > 247 263  < > = values in this interval not displayed. Cardiac Enzymes:  Recent Labs Lab 07/21/14 0223  TROPONINI <0.30   BNP: No components found with this basename: POCBNP,  CBG: No results found for this basename: GLUCAP,  in the last 168 hours   Micro Results: Recent Results (from the past 240 hour(s))  URINE CULTURE     Status: None   Collection Time    07/21/14  3:35 AM      Result Value Ref Range Status   Specimen Description URINE, CLEAN CATCH   Final   Special Requests ADDED [id number] [phone]   Final   Culture  Setup Time     Final   Value: 07/21/2014 11:30     Performed at [place]     Final   Value: >=100,000 COLONIES/ML     Performed at [facility]   Culture     Final   Value: Multiple bacterial morphotypes present, none predominant. Suggest appropriate recollection if clinically indicated.     Performed at [facility]   Report Status 07/22/2014 FINAL   Final  MRSA PCR SCREENING     Status: None   Collection Time    07/21/14  8:51 PM      Result Value Ref Range Status   MRSA by PCR NEGATIVE  NEGATIVE Final   Comment:            The GeneXpert MRSA Assay (FDA     approved for NASAL specimens     only), is one component of a     comprehensive MRSA colonization     surveillance program. It is not     intended to diagnose MRSA     infection nor to guide or     monitor treatment for     MRSA infections.    Studies/Results: Dg Chest Port 1 View  07/22/2014   CLINICAL  DATA:  Dyspnea with history of hypertension and heart murmur and reflux  EXAM: PORTABLE CHEST - 1 VIEW  COMPARISON:  Acute abdominal series of July 21, 2014  FINDINGS: The lungs are well-expanded. The interstitial markings remain increased predominantly on the left in the mid and lower lung zone. The cardiopericardial silhouette top-normal in size. There is a large hiatal hernia-partially intrathoracic stomach. There is no pleural effusion or pneumothorax. There is mild tortuosity of the descending thoracic aorta. The bony thorax is unremarkable.  IMPRESSION: There remain increased interstitial markings predominantly on the left in the mid and lower lung consistent with interstitial edema. There is no alveolar pneumonia. A large hiatal hernia-partially intrathoracic stomach persists placing the patient at aspiration risk.   Electronically Signed   By: David  Martinique   On: 07/22/2014 08:05   Dg Abd Acute W/chest  07/21/2014   CLINICAL DATA:  Initial evaluation for epigastric pain, nausea vomiting, hematemesis.  EXAM: ACUTE ABDOMEN SERIES (ABDOMEN 2 VIEW & CHEST 1 VIEW)  COMPARISON:  Prior radiograph from 03/13/2011  FINDINGS: Mild cardiomegaly is stable from prior study. Atherosclerotic calcifications present within the aortic arch. Mediastinal silhouette otherwise unremarkable.  Lungs are normally inflated. No focal infiltrate, pulmonary edema, or pleural effusion. There is no pneumothorax.  Large hiatal hernia with air-fluid level present.  ACDF overlies cervical spine.  Paucity of gas limits evaluation of the bowels. No evidence of obstruction or ileus. No free air. No soft tissue mass or abnormal calcifications. Cholecystectomy clips noted.  Mild dextroscoliosis of the lumbar spine with associated multilevel degenerative changes present. No acute osseus abnormality. Degenerative changes about the hips bilaterally is well.  IMPRESSION: 1. Large hiatal hernia.  Nonobstructive bowel gas pattern. 2. Stable  cardiomegaly with no acute cardiopulmonary abnormality.   Electronically Signed   By: Jeannine Boga M.D.   On: 07/21/2014 03:09    Medications: Scheduled Meds: . amoxicillin-clavulanate  1 tablet Oral Q12H  . bisacodyl  10 mg Rectal Once  . pantoprazole  40 mg Oral BID AC  . sodium chloride  3 mL Intravenous Q12H      LOS: 4 days   RAI,RIPUDEEP M.D. [facility] 07/25/2014, 10:20 AM Pager: [phone]  If 7PM-7AM, please contact night-coverage [email or web address] Password [email or web address]

## 2014-07-25 NOTE — Progress Notes (Signed)
Physical Therapy Treatment Patient Details Name: [name] MRN: [id number] DOB: 02/11/1918 Today's Date: 07/25/2014    History of Present Illness Patient is a 80 yo female admitted 07/21/14 with hematemesis.  Patient had an episode of resp distress due to volume overload - resolved.  Today patient c/o painful Rt foot.  RN notified.  PMH: GERD, aortic stenosis, HTN, hiatal hernia.    PT Comments    Patient improving with mobility today.  Was able to ambulate short distances.  Continues to be limited by significant pain in Rt foot.  Of note, per RN, patient has not had pain meds this am - requested at this time.  Follow Up Recommendations  Home health PT;Supervision/Assistance - 24 hour (Daughter to assist initially, then possibly son)     Equipment Recommendations  None recommended by PT    Recommendations for Other Services       Precautions / Restrictions Precautions Precautions: Fall Restrictions Weight Bearing Restrictions: No    Mobility  Bed Mobility Overal bed mobility: Needs Assistance Bed Mobility: Supine to Sit     Supine to sit: Supervision     General bed mobility comments: Supervision for safety only.  Transfers Overall transfer level: Needs assistance Equipment used: Rolling walker (2 wheeled) Transfers: Sit to/from Stand Sit to Stand: Min assist         General transfer comment: Verbal cues for hand placement and technique.  Assist to rise to standing from bed and from low toilet.  Once upright, patient with good balance with use of RW.  Ambulation/Gait Ambulation/Gait assistance: Min guard Ambulation Distance (Feet): 32 Feet (with 1 sitting rest break) Assistive device: Rolling walker (2 wheeled) Gait Pattern/deviations: Step-to pattern;Decreased stance time - right;Decreased step length - left;Decreased weight shift to right;Shuffle;Antalgic;Trunk flexed Gait velocity: Decreased Gait velocity interpretation: Below normal speed for  age/gender General Gait Details: Verbal cues for safe use of RW.  Patient with antalgic gait pattern.  Able to ambulate short distance today.  Letf up in chair with LE's elevated.   Stairs            Wheelchair Mobility    Modified Rankin (Stroke Patients Only)       Balance                                    Cognition Arousal/Alertness: Awake/alert Behavior During Therapy: WFL for tasks assessed/performed Overall Cognitive Status: Within Functional Limits for tasks assessed                      Exercises      General Comments        Pertinent Vitals/Pain Pain Assessment: 0-10 Pain Score: 9  (with ambulation) Pain Location: Rt foot Pain Descriptors / Indicators: Aching;Sharp Pain Intervention(s): Limited activity within patient's tolerance;Patient requesting pain meds-RN notified    Home Living                      Prior Function            PT Goals (current goals can now be found in the care plan section) Progress towards PT goals: Progressing toward goals    Frequency  Min 3X/week    PT Plan Current plan remains appropriate    Co-evaluation             End of Session Equipment Utilized During Treatment: Gait  belt Activity Tolerance: Patient limited by pain Patient left: in chair;with call bell/phone within reach;with chair alarm set;with nursing/sitter in room     Time: [id number] PT Time Calculation (min): 37 min  Charges:  $Gait Training: 23-37 mins                    G Codes:      Vena AustriaDavis, Tyrann Donaho H 07/25/2014, 12:21 PM Durenda HurtSusan H. Renaldo Fiddleravis, PT, [facility] Acute Rehab Services Pager [phone]

## 2014-07-25 NOTE — Consult Note (Signed)
  Dr. Isidoro Donningai asked if I could see [name] due to her acute onset of right foot pain that has now made weight-bearing difficult.  Her left foot is starting to hurt her some at the great toe she reports.  No history of trauma or gout.  She points to the forefoot on her right foot.  Neither foot is swollen.  Her pain is minimal on examination of her left foot, especially the first ray.  She does have significant pain around her MTP joints of the right foot, particularly the 3rd and 4th MTP joints.  There is no gross deformity in this area, but there is pain with manipulation, suggesting at least an inflammatory process.  From my standpoint, I spoke with her and her daughter about bringing supportive shoes in such as tennis shoes and putting these on her feet when she gets up to ambulate. Even a dose of prednisone or even a steroid taper would be worth trying since she can not likely be on NSAIDs for now due to her GI bleed.

## 2014-07-26 ENCOUNTER — Inpatient Hospital Stay (HOSPITAL_COMMUNITY)
Admission: EM | Admit: 2014-07-26 | Discharge: 2014-08-15 | Disposition: E | Payer: Medicare Other | Source: Home / Self Care | Attending: Family Medicine | Admitting: Family Medicine

## 2014-07-26 ENCOUNTER — Emergency Department (HOSPITAL_COMMUNITY): Payer: Medicare Other

## 2014-07-26 ENCOUNTER — Encounter (HOSPITAL_COMMUNITY): Payer: Self-pay | Admitting: Emergency Medicine

## 2014-07-26 DIAGNOSIS — K562 Volvulus: Secondary | ICD-10-CM

## 2014-07-26 DIAGNOSIS — R1084 Generalized abdominal pain: Secondary | ICD-10-CM

## 2014-07-26 DIAGNOSIS — R109 Unspecified abdominal pain: Secondary | ICD-10-CM | POA: Diagnosis present

## 2014-07-26 DIAGNOSIS — E876 Hypokalemia: Secondary | ICD-10-CM | POA: Diagnosis present

## 2014-07-26 DIAGNOSIS — M79672 Pain in left foot: Secondary | ICD-10-CM

## 2014-07-26 DIAGNOSIS — I10 Essential (primary) hypertension: Secondary | ICD-10-CM

## 2014-07-26 DIAGNOSIS — M79671 Pain in right foot: Secondary | ICD-10-CM

## 2014-07-26 DIAGNOSIS — R1013 Epigastric pain: Secondary | ICD-10-CM

## 2014-07-26 DIAGNOSIS — K449 Diaphragmatic hernia without obstruction or gangrene: Secondary | ICD-10-CM

## 2014-07-26 DIAGNOSIS — D72829 Elevated white blood cell count, unspecified: Secondary | ICD-10-CM | POA: Diagnosis present

## 2014-07-26 DIAGNOSIS — R112 Nausea with vomiting, unspecified: Secondary | ICD-10-CM

## 2014-07-26 DIAGNOSIS — R0602 Shortness of breath: Secondary | ICD-10-CM

## 2014-07-26 LAB — CBC WITH DIFFERENTIAL/PLATELET
Basophils Absolute: 0 10*3/uL (ref 0.0–0.1)
Basophils Relative: 0 % (ref 0–1)
EOS PCT: 0 % (ref 0–5)
Eosinophils Absolute: 0 10*3/uL (ref 0.0–0.7)
HEMATOCRIT: 37.9 % (ref 36.0–46.0)
HEMOGLOBIN: 12.7 g/dL (ref 12.0–15.0)
LYMPHS ABS: 0.9 10*3/uL (ref 0.7–4.0)
LYMPHS PCT: 7 % — AB (ref 12–46)
MCH: 30.2 pg (ref 26.0–34.0)
MCHC: 33.5 g/dL (ref 30.0–36.0)
MCV: 90.2 fL (ref 78.0–100.0)
MONO ABS: 0.9 10*3/uL (ref 0.1–1.0)
MONOS PCT: 7 % (ref 3–12)
Neutro Abs: 11 10*3/uL — ABNORMAL HIGH (ref 1.7–7.7)
Neutrophils Relative %: 86 % — ABNORMAL HIGH (ref 43–77)
PLATELETS: 354 10*3/uL (ref 150–400)
RBC: 4.2 MIL/uL (ref 3.87–5.11)
RDW: 13.9 % (ref 11.5–15.5)
WBC: 12.8 10*3/uL — AB (ref 4.0–10.5)

## 2014-07-26 LAB — COMPREHENSIVE METABOLIC PANEL
ALBUMIN: 3.1 g/dL — AB (ref 3.5–5.2)
ALK PHOS: 68 U/L (ref 39–117)
ALT: 19 U/L (ref 0–35)
AST: 18 U/L (ref 0–37)
Anion gap: 17 — ABNORMAL HIGH (ref 5–15)
BUN: 15 mg/dL (ref 6–23)
CALCIUM: 9.1 mg/dL (ref 8.4–10.5)
CO2: 23 mEq/L (ref 19–32)
Chloride: 101 mEq/L (ref 96–112)
Creatinine, Ser: 0.68 mg/dL (ref 0.50–1.10)
GFR calc non Af Amer: 73 mL/min — ABNORMAL LOW (ref >90)
GFR, EST AFRICAN AMERICAN: 84 mL/min — AB (ref >90)
GLUCOSE: 133 mg/dL — AB (ref 70–99)
POTASSIUM: 3.3 meq/L — AB (ref 3.7–5.3)
SODIUM: 141 meq/L (ref 137–147)
TOTAL PROTEIN: 7.6 g/dL (ref 6.0–8.3)
Total Bilirubin: 0.2 mg/dL — ABNORMAL LOW (ref 0.3–1.2)

## 2014-07-26 LAB — LIPASE, BLOOD: Lipase: 12 U/L (ref 11–59)

## 2014-07-26 LAB — I-STAT TROPONIN, ED: Troponin i, poc: 0.02 ng/mL (ref 0.00–0.08)

## 2014-07-26 MED ORDER — SODIUM CHLORIDE 0.9 % IJ SOLN
3.0000 mL | Freq: Two times a day (BID) | INTRAMUSCULAR | Status: DC
  Administered 2014-07-27 (×2): 3 mL via INTRAVENOUS

## 2014-07-26 MED ORDER — PANTOPRAZOLE SODIUM 40 MG IV SOLR
40.0000 mg | Freq: Once | INTRAVENOUS | Status: AC
  Administered 2014-07-26: 40 mg via INTRAVENOUS
  Filled 2014-07-26: qty 40

## 2014-07-26 MED ORDER — FENTANYL CITRATE 0.05 MG/ML IJ SOLN
25.0000 ug | Freq: Once | INTRAMUSCULAR | Status: AC
  Administered 2014-07-26: 25 ug via INTRAVENOUS
  Filled 2014-07-26: qty 2

## 2014-07-26 MED ORDER — PREDNISONE 20 MG PO TABS
40.0000 mg | ORAL_TABLET | Freq: Every day | ORAL | Status: DC
  Filled 2014-07-26: qty 2

## 2014-07-26 MED ORDER — HYDROMORPHONE HCL 1 MG/ML IJ SOLN
1.0000 mg | INTRAMUSCULAR | Status: DC | PRN
  Administered 2014-07-28: 1 mg via INTRAVENOUS
  Filled 2014-07-26: qty 1

## 2014-07-26 MED ORDER — DOCUSATE SODIUM 100 MG PO CAPS
100.0000 mg | ORAL_CAPSULE | Freq: Two times a day (BID) | ORAL | Status: DC | PRN
  Filled 2014-07-26: qty 1

## 2014-07-26 MED ORDER — SODIUM CHLORIDE 0.9 % IV SOLN
INTRAVENOUS | Status: AC
  Administered 2014-07-27: 01:00:00 via INTRAVENOUS

## 2014-07-26 MED ORDER — IOHEXOL 300 MG/ML  SOLN
50.0000 mL | Freq: Once | INTRAMUSCULAR | Status: AC | PRN
  Administered 2014-07-26: 50 mL via ORAL

## 2014-07-26 MED ORDER — POTASSIUM CHLORIDE 10 MEQ/100ML IV SOLN
10.0000 meq | INTRAVENOUS | Status: AC
  Administered 2014-07-27 (×2): 10 meq via INTRAVENOUS
  Filled 2014-07-26: qty 100

## 2014-07-26 MED ORDER — PANTOPRAZOLE SODIUM 40 MG PO TBEC: 40.0000 mg | DELAYED_RELEASE_TABLET | Freq: Two times a day (BID) | ORAL | Status: AC

## 2014-07-26 MED ORDER — DSS 100 MG PO CAPS: 100.0000 mg | ORAL_CAPSULE | Freq: Two times a day (BID) | ORAL | Status: AC | PRN

## 2014-07-26 MED ORDER — TRAMADOL HCL 50 MG PO TABS: 50.0000 mg | ORAL_TABLET | Freq: Four times a day (QID) | ORAL | Status: AC | PRN

## 2014-07-26 MED ORDER — FAMOTIDINE IN NACL 20-0.9 MG/50ML-% IV SOLN
20.0000 mg | Freq: Once | INTRAVENOUS | Status: AC
  Administered 2014-07-26: 20 mg via INTRAVENOUS
  Filled 2014-07-26: qty 50

## 2014-07-26 MED ORDER — LORATADINE 10 MG PO TABS
10.0000 mg | ORAL_TABLET | Freq: Every day | ORAL | Status: DC | PRN
  Filled 2014-07-26: qty 1

## 2014-07-26 MED ORDER — TRAMADOL HCL 50 MG PO TABS
50.0000 mg | ORAL_TABLET | Freq: Four times a day (QID) | ORAL | Status: DC | PRN
  Administered 2014-07-27: 50 mg via ORAL
  Filled 2014-07-26: qty 1

## 2014-07-26 MED ORDER — IOHEXOL 300 MG/ML  SOLN
100.0000 mL | Freq: Once | INTRAMUSCULAR | Status: AC | PRN
  Administered 2014-07-26: 100 mL via INTRAVENOUS

## 2014-07-26 MED ORDER — SIMETHICONE 80 MG PO CHEW
80.0000 mg | CHEWABLE_TABLET | Freq: Four times a day (QID) | ORAL | Status: DC | PRN
  Filled 2014-07-26: qty 1

## 2014-07-26 MED ORDER — PREGABALIN 75 MG PO CAPS: 75.0000 mg | ORAL_CAPSULE | Freq: Two times a day (BID) | ORAL | Status: AC

## 2014-07-26 MED ORDER — ONDANSETRON HCL 4 MG/2ML IJ SOLN
4.0000 mg | Freq: Four times a day (QID) | INTRAMUSCULAR | Status: DC | PRN
  Administered 2014-07-27 – 2014-07-28 (×3): 4 mg via INTRAVENOUS
  Filled 2014-07-26 (×3): qty 2

## 2014-07-26 MED ORDER — ONDANSETRON HCL 4 MG PO TABS: 4.0000 mg | ORAL_TABLET | Freq: Four times a day (QID) | ORAL | Status: DC | PRN

## 2014-07-26 MED ORDER — PANTOPRAZOLE SODIUM 40 MG PO TBEC: 40.0000 mg | DELAYED_RELEASE_TABLET | Freq: Two times a day (BID) | ORAL | Status: DC

## 2014-07-26 MED ORDER — OXYCODONE-ACETAMINOPHEN 5-325 MG PO TABS: 1.0000 | ORAL_TABLET | ORAL | Status: DC | PRN

## 2014-07-26 MED ORDER — PANTOPRAZOLE SODIUM 40 MG IV SOLR
40.0000 mg | Freq: Two times a day (BID) | INTRAVENOUS | Status: DC
  Administered 2014-07-27 (×3): 40 mg via INTRAVENOUS
  Filled 2014-07-26 (×4): qty 40

## 2014-07-26 MED ORDER — ACETAMINOPHEN 650 MG RE SUPP: 650.0000 mg | Freq: Four times a day (QID) | RECTAL | Status: DC | PRN

## 2014-07-26 MED ORDER — PROMETHAZINE HCL 25 MG/ML IJ SOLN
12.5000 mg | Freq: Four times a day (QID) | INTRAMUSCULAR | Status: DC | PRN
  Filled 2014-07-26: qty 1

## 2014-07-26 MED ORDER — PREDNISONE 10 MG PO TABS: ORAL_TABLET | ORAL | Status: AC

## 2014-07-26 MED ORDER — ONDANSETRON HCL 4 MG/2ML IJ SOLN
4.0000 mg | Freq: Once | INTRAMUSCULAR | Status: AC
  Administered 2014-07-26: 4 mg via INTRAVENOUS
  Filled 2014-07-26: qty 2

## 2014-07-26 MED ORDER — PREGABALIN 75 MG PO CAPS
75.0000 mg | ORAL_CAPSULE | Freq: Two times a day (BID) | ORAL | Status: DC
  Administered 2014-07-27: 75 mg via ORAL
  Filled 2014-07-26: qty 1

## 2014-07-26 MED ORDER — ACETAMINOPHEN 325 MG PO TABS: 650.0000 mg | ORAL_TABLET | Freq: Four times a day (QID) | ORAL | Status: DC | PRN

## 2014-07-26 NOTE — Discharge Summary (Addendum)
Physician Discharge Summary  Patient ID: [name] MRN: [id number] DOB/AGE: [birth year] 80 y.o.  Admit date: 07/21/2014 Discharge date: 08/13/2014  Primary Care Physician:  Landry Dyke, MD  Discharge Diagnoses:    . Hematemesis . HTN (hypertension) . GI bleed . Laryngitis, acute . Respiratory failure, acute . Pain in both feet likely due to plantar fasciitis versus neuropathic pain  Consults: Orthopedics Gastroenterology General surgery   Recommendations for Outpatient Follow-up:  Patient was recommended to avoid constipation due to recent finding of gastric volvulus  She was recommended to followup with podiatry regarding plantar fasciitis  Allergies:   Allergies  Allergen Reactions  . Codeine Nausea Only     Discharge Medications:   Medication List    STOP taking these medications       naproxen sodium 220 MG tablet  Commonly known as:  ANAPROX     PEPCID PO      TAKE these medications       dextromethorphan-guaiFENesin 30-600 MG per 12 hr tablet  Commonly known as:  MUCINEX DM  Take 1 tablet by mouth daily as needed for cough.     DSS 100 MG Caps  Take 100 mg by mouth 2 (two) times daily as needed (constipation).     GAS-X EXTRA STRENGTH PO  Take 1 tablet by mouth daily as needed (gas).     loratadine 10 MG tablet  Commonly known as:  CLARITIN  Take 10 mg by mouth daily as needed for allergies.     pantoprazole 40 MG tablet  Commonly known as:  PROTONIX  Take 1 tablet (40 mg total) by mouth 2 (two) times daily before a meal.     predniSONE 10 MG tablet  Commonly known as:  DELTASONE  Prednisone dosing: Take  Prednisone 71m (4 tabs) x 1 days, then taper to 362m(3 tabs) x 2  days, then 20109m2 tabs) x 2days, then 39m7m tab) x 2 days, then OFF.  Start taking on:  07/27/2014     pregabalin 75 MG capsule  Commonly known as:  LYRICA  Take 1 capsule (75 mg total) by mouth 2 (two) times daily.     traMADol 50 MG tablet  Commonly  known as:  ULTRAM  Take 1 tablet (50 mg total) by mouth every 6 (six) hours as needed for moderate pain.         Brief H and P: For complete details please refer to admission H and P, but in brief [name] is a 80 y24. female with Past medical history of GERD, hiatal hernia, moderate aortic stenosis, hypertension.  The patient is presenting with complaints of vomiting of blood. Patient was at her baseline earlier during the day. She went to [employer] she eats once a week earlier during the day. Later on in supper she had a salad which was in the fridge for last few days. After which he started having some acid reflux and then had an episode of vomiting. As per the daughter initially patient was vomiting yellow liquid. But later on she started having dark brown liquid in her vomit.  Patient continues to have severe epigastric pain which she describes as discomfort as well as sharp burning pain.  She had 2 Mcclure motions and one of them was dark.   Hospital Course:  80 y25r old female with history of GERD, hiatal hernia, moderate aortic stenosis, hypertension presented with coffee-ground emesis. GI has been consulted. Patient underwent EGD which showed large hiatal  hernia with probable partial volvulus. Patient has been seen by surgery and recommendation nonoperative management.  Hematemesis with Paraesophageal hernia with partial volvulus; resolved Patient was admitted to step down unit for further workup. GI was consulted and patient underwent EGD which showed large hiatal hernia with probable partial volvulus. General surgery was consulted and felt that this is likely long-standing issue and she is not a candidate for surgery. If unable to eat, consider J-tube for enteral access however patient is not interested. Patient was placed on clear liquids and advanced diet to soft solids and she has been tolerating without any difficulty. Continue PPI  Right  and left feet pain: Unclear  etiology possibly plantar fasciitis versus gout vs neuropathic. ESR 90, CRP 9.6, uric acid normal. X-ray of the right foot neg. Unable to give NSAIDs due to recent hematemesis/GI bleed. Patient was placed on prednisone, Lyrica and tramadol for pain. Orthopedics was consulted, patient was seen by Dr. Ninfa Linden who agreed with the above management.  Acute hypoxic respiratory failure -  resolved, likely due to fluid overload patient received 20 mg of IV Lasix.  acute laryngitis- after EGD Resolved  HTN (hypertension):  stable   acute laryngitis: Resolved, was placed on Augmentin,  No pneumonia on chest x-ray, improving    UTI/pyuria:  - Urine culture showed more than 100,000 colonies of multiple bacterial morphotypes, repeat UA on 10/8 was negative for UTI     Day of Discharge BP 153/64  Pulse 74  Temp(Src) 97.8 F (36.6 C) (Oral)  Resp 20  Ht _0  (1.6 m)  Wt 70.6 kg (155 lb 10.3 oz)  BMI 27.58 kg/m2  SpO2 98%  Physical Exam: General: Alert and awake oriented x3 not in any acute distress. HEENT: anicteric sclera, pupils reactive to light and accommodation CVS: S1-S2 clear no murmur rubs or gallops Chest: clear to auscultation bilaterally, no wheezing rales or rhonchi Abdomen: soft nontender, nondistended, normal bowel sounds Extremities: no cyanosis, clubbing or edema noted bilaterally Neuro: Cranial nerves II-XII intact, no focal neurological deficits   The results of significant diagnostics from this hospitalization (including imaging, microbiology, ancillary and laboratory) are listed below for reference.    LAB RESULTS: Basic Metabolic Panel:  Recent Labs Lab 07/24/14 0340 07/25/14 0530  NA 142 141  K 3.3* 3.8  CL 104 105  CO2 26 23  GLUCOSE 104* 111*  BUN 7 8  CREATININE 0.80 0.72  CALCIUM 8.1* 8.3*   Liver Function Tests:  Recent Labs Lab 07/21/14 0223  AST 18  ALT 14  ALKPHOS 76  BILITOT 0.3  PROT 8.3  ALBUMIN 4.1    Recent Labs Lab  07/21/14 0223  LIPASE 18   No results found for this basename: AMMONIA,  in the last 168 hours CBC:  Recent Labs Lab 07/21/14 0223  07/24/14 0340 07/25/14 0530  WBC 12.9*  < > 14.2* 13.6*  NEUTROABS 11.5*  --   --   --   HGB 14.5  < > 10.8* 10.8*  HCT 42.9  < > 32.4* 32.4*  MCV 91.1  < > 91.3 90.8  PLT 258  < > 247 263  < > = values in this interval not displayed. Cardiac Enzymes:  Recent Labs Lab 07/21/14 0223  TROPONINI <0.30   BNP: No components found with this basename: POCBNP,  CBG: No results found for this basename: GLUCAP,  in the last 168 hours  Significant Diagnostic Studies:  Dg Chest Port 1 View  07/22/2014   CLINICAL  DATA:  Dyspnea with history of hypertension and heart murmur and reflux  EXAM: PORTABLE CHEST - 1 VIEW  COMPARISON:  Acute abdominal series of July 21, 2014  FINDINGS: The lungs are well-expanded. The interstitial markings remain increased predominantly on the left in the mid and lower lung zone. The cardiopericardial silhouette top-normal in size. There is a large hiatal hernia-partially intrathoracic stomach. There is no pleural effusion or pneumothorax. There is mild tortuosity of the descending thoracic aorta. The bony thorax is unremarkable.  IMPRESSION: There remain increased interstitial markings predominantly on the left in the mid and lower lung consistent with interstitial edema. There is no alveolar pneumonia. A large hiatal hernia-partially intrathoracic stomach persists placing the patient at aspiration risk.   Electronically Signed   By: David  Martinique   On: 07/22/2014 08:05   Dg Abd Acute W/chest  07/21/2014   CLINICAL DATA:  Initial evaluation for epigastric pain, nausea vomiting, hematemesis.  EXAM: ACUTE ABDOMEN SERIES (ABDOMEN 2 VIEW & CHEST 1 VIEW)  COMPARISON:  Prior radiograph from 03/13/2011  FINDINGS: Mild cardiomegaly is stable from prior study. Atherosclerotic calcifications present within the aortic arch. Mediastinal silhouette  otherwise unremarkable.  Lungs are normally inflated. No focal infiltrate, pulmonary edema, or pleural effusion. There is no pneumothorax.  Large hiatal hernia with air-fluid level present.  ACDF overlies cervical spine.  Paucity of gas limits evaluation of the bowels. No evidence of obstruction or ileus. No free air. No soft tissue mass or abnormal calcifications. Cholecystectomy clips noted.  Mild dextroscoliosis of the lumbar spine with associated multilevel degenerative changes present. No acute osseus abnormality. Degenerative changes about the hips bilaterally is well.  IMPRESSION: 1. Large hiatal hernia.  Nonobstructive bowel gas pattern. 2. Stable cardiomegaly with no acute cardiopulmonary abnormality.   Electronically Signed   By: Jeannine Boga M.D.   On: 07/21/2014 03:09    Disposition and Follow-up: Discharge Instructions   Discharge instructions    Complete by:  As directed   Diet: SOFT diet with thin liquids     Increase activity slowly    Complete by:  As directed             DISPOSITION:  home with home PT, OT  DIET: soft  DISCHARGE FOLLOW-UP Follow-up Information   Follow up with [facility], MD. Schedule an appointment as soon as possible for a visit on 08/09/2014. (for hospital follow-up)    Specialty:  Gastroenterology   Contact information:   [address] [place] [address] [phone]       Schedule an appointment as soon as possible for a visit in 1 week to follow up. (for hospital follow-up if no significant improvement in your foot pain. )    Contact information:   please follow with Podiatry, [place] or [place]       Time spent on Discharge: 40 mins  Signed:   RAI,RIPUDEEP M.D. [facility] 07/21/2014, 1:30 PM Pager: [phone]  Addendum Coding Query: unable to determine definitely if neuropathic pain versus plantar fasciitis versus osteoarthritis of the foot, probably  osteoarthritis  Coding query: EKG finding rate 65, LAFB, first-degree AV block  RAI,RIPUDEEP M.D. [facility] 08/10/2014, 2:34 PM  Pager: [phone]

## 2014-07-26 NOTE — ED Provider Notes (Signed)
CSN: [id number]     Arrival date & time 08/13/2014  1823 History   First MD Initiated Contact with Patient 07/17/2014 1855     Chief Complaint  Patient presents with  . Emesis     (Consider location/radiation/quality/duration/timing/severity/associated sxs/prior Treatment) HPI 80 year old female discharge today from [facility] after having upper GI bleed with abdominal pain vomiting and partial gastric volvulus was deemed not to be a surgical candidate felt well this morning just prior to discharge but immediately upon discharge patient had recurrent upper abdominal mild pain epigastric and chest pain diffuse anterior constant mild aching all day for the last 8 hours with nausea now with one hour of several episodes of nonbloody vomiting with patient passing flatus today with last bowel movement 2 days ago with no shortness breath no fever no cough; patient and family state the emergency department is supposed to page the gastroenterologist when the patient returns to the ED tonight. She thinks she received Zofran prior to arrival from EMS with partial improvement in her nausea. Past Medical History  Diagnosis Date  . GERD (gastroesophageal reflux disease)   . Hyperlipidemia   . Moderate aortic stenosis   . HTN (hypertension)   . Heart murmur   . Hernia, epigastric    Past Surgical History  Procedure Laterality Date  . Appendectomy    . Cataract surgery    . Cholecystectomy    . Anterior cervical decomp/discectomy fusion    . Esophagogastroduodenoscopy N/A 07/21/2014    Procedure: ESOPHAGOGASTRODUODENOSCOPY (EGD);  Surgeon: Petra Kuba, MD;  Location: [facility] ENDOSCOPY;  Service: Endoscopy;  Laterality: N/A;   Family History  Problem Relation Age of Onset  . Coronary artery disease      Postive in family   History  Substance Use Topics  . Smoking status: Never Smoker   . Smokeless tobacco: Not on file  . Alcohol Use: No   OB History    No data available     Review of  Systems 10 Systems reviewed and are negative for acute change except as noted in the HPI.   Allergies  Codeine  Home Medications   Prior to Admission medications   Medication Sig Start Date End Date Taking? Authorizing Provider  dextromethorphan-guaiFENesin (MUCINEX DM) 30-600 MG per 12 hr tablet Take 1 tablet by mouth daily as needed for cough (cough).    Yes Historical Provider, MD  Docusate Sodium (DSS) 100 MG CAPS Take 100 mg by mouth 2 (two) times daily as needed (constipation). 08/12/2014  Yes Ripudeep Jenna Luo, MD  loratadine (CLARITIN) 10 MG tablet Take 10 mg by mouth daily as needed for allergies (allergies).    Yes Historical Provider, MD  pantoprazole (PROTONIX) 40 MG tablet Take 1 tablet (40 mg total) by mouth 2 (two) times daily before a meal. 08/11/2014  Yes Ripudeep K Rai, MD  pregabalin (LYRICA) 75 MG capsule Take 1 capsule (75 mg total) by mouth 2 (two) times daily. 07/20/2014  Yes Ripudeep Jenna Luo, MD  Simethicone (GAS-X EXTRA STRENGTH PO) Take 1 tablet by mouth daily as needed (gas).    Yes Historical Provider, MD  predniSONE (DELTASONE) 10 MG tablet Prednisone dosing: Take  Prednisone 40mg  (4 tabs) x 1 days, then taper to 30mg  (3 tabs) x 2  days, then 20mg  (2 tabs) x 2days, then 10mg  (1 tab) x 2 days, then OFF. 07/27/14   Ripudeep K Rai, MD  traMADol (ULTRAM) 50 MG tablet Take 1 tablet (50 mg total) by mouth  every 6 (six) hours as needed for moderate pain. 07/27/2014   Ripudeep Jenna LuoK Rai, MD   BP 52/34 mmHg  Pulse 140  Temp(Src) 97.6 F (36.4 C) (Oral)  Resp 35  Ht 5\' 3"  (1.6 m)  Wt 151 lb 3.8 oz (68.6 kg)  BMI 26.80 kg/m2  SpO2 75% Physical Exam  Nursing note and vitals reviewed. Constitutional:  Awake, alert, nontoxic appearance.  HENT:  Head: Atraumatic.  Eyes: Right eye exhibits no discharge. Left eye exhibits no discharge.  Neck: Neck supple.  Cardiovascular: Normal rate and regular rhythm.   No murmur heard. Pulmonary/Chest: Effort normal and breath sounds normal. No  respiratory distress. She has no wheezes. She has no rales. She exhibits no tenderness.  Pulse oximetry normal on room air 98%  Abdominal: Soft. Bowel sounds are normal. She exhibits no distension and no mass. There is tenderness. There is no rebound and no guarding.  Minimal if any epigastric tenderness with the rest of the abdomen nontender  Musculoskeletal: She exhibits no tenderness.  Baseline ROM, no obvious new focal weakness.  Neurological: She is alert.  Mental status and motor strength appears baseline for patient and situation.  Skin: No rash noted.  Psychiatric: She has a normal mood and affect.    ED Course  Procedures (including critical care time) D/w GI recs CT tonight, UGI in AM, Obs via [facility], GI will consult in AM. 2000 D/w [facility] for admit. 2240 Patient / Family / Caregiver informed of clinical course, understand medical decision-making process, and agree with plan. Labs Review Labs Reviewed  CBC WITH DIFFERENTIAL - Abnormal; Notable for the following:    WBC 12.8 (*)    Neutrophils Relative % 86 (*)    Neutro Abs 11.0 (*)    Lymphocytes Relative 7 (*)    All other components within normal limits  COMPREHENSIVE METABOLIC PANEL - Abnormal; Notable for the following:    Potassium 3.3 (*)    Glucose, Bld 133 (*)    Albumin 3.1 (*)    Total Bilirubin 0.2 (*)    GFR calc non Af Amer 73 (*)    GFR calc Af Amer 84 (*)    Anion gap 17 (*)    All other components within normal limits  URINALYSIS, ROUTINE W REFLEX MICROSCOPIC - Abnormal; Notable for the following:    APPearance CLOUDY (*)    Leukocytes, UA SMALL (*)    All other components within normal limits  COMPREHENSIVE METABOLIC PANEL - Abnormal; Notable for the following:    Potassium 3.2 (*)    Glucose, Bld 117 (*)    Albumin 3.0 (*)    GFR calc non Af Amer 73 (*)    GFR calc Af Amer 84 (*)    All other components within normal limits  CBC - Abnormal; Notable for the following:    WBC 12.9 (*)    All  other components within normal limits  URINE MICROSCOPIC-ADD ON - Abnormal; Notable for the following:    Bacteria, UA FEW (*)    All other components within normal limits  GLUCOSE, CAPILLARY - Abnormal; Notable for the following:    Glucose-Capillary 135 (*)    All other components within normal limits  BLOOD GAS, ARTERIAL - Abnormal; Notable for the following:    pH, Arterial 7.320 (*)    pCO2 arterial 47.5 (*)    pO2, Arterial 41.8 (*)    Acid-base deficit 2.2 (*)    All other components within normal limits  LIPASE, BLOOD  MAGNESIUM  PHOSPHORUS  PROTIME-INR  I-STAT TROPOININ, ED    Imaging Review No results found.   EKG Interpretation   Date/Time:  Monday July 26 2014 19:19:55 EDT Ventricular Rate:  65 PR Interval:  219 QRS Duration: 90 QT Interval:  384 QTC Calculation: 399 R Axis:   -49 Text Interpretation:  Sinus rhythm First degree A-V block Left anterior  fascicular block Consider anterior infarct No significant change since  last tracing Confirmed by [facility]  MD, Jonny RuizJOHN ([id number]) on 08/10/2014 7:34:02  PM Also confirmed by [facility]  MD, Jonny RuizJOHN ([id number]), editor [place]  CCT,  BEVERLY ([id number])  on 07/27/2014 7:57:15 AM      MDM   Final diagnoses:  Epigastric pain  Non-intractable vomiting with nausea, vomiting of unspecified type    The patient appears reasonably stabilized for admission considering the current resources, flow, and capabilities available in the ED at this time, and I doubt any other [facility] requiring further screening and/or treatment in the ED prior to admission.    Hurman HornJohn M Jameal Razzano, MD 08/15/14 2147

## 2014-07-26 NOTE — Progress Notes (Signed)
CARE MANAGEMENT NOTE 08/11/2014  Patient:  [name],[name]   Account Number:  [id number]  Date Initiated:  07/22/2014  Documentation initiated by:  [name]  Subjective/Objective Assessment:   Pt admitted with UGIB/bloody emesis     Action/Plan:   PTA pt lived at home- NCM to follow for d/c needs   Anticipated DC Date:  07/24/2014   Anticipated DC Plan:  HOME W HOME HEALTH SERVICES      DC Planning Services  CM consult      Choice offered to / List presented to:             Status of service:  Completed, signed off Medicare Important Message given?  YES (If response is "NO", the following Medicare IM given date fields will be blank) Date Medicare IM given:  07/23/2014 Medicare IM given by:  [name] Date Additional Medicare IM given:   Additional Medicare IM given by:    Discharge Disposition:  HOME W HOME HEALTH SERVICES  Per UR Regulation:  Reviewed for med. necessity/level of care/duration of stay  If discussed at Long Length of Stay Meetings, dates discussed:    Comments:  08/11/2014 1154 NCM left message on dtr's, [name] [phone]. HH PT needed. Waiting for call back to set up [name] [name]  07/23/14- 1200- Alexandra PieriniKristi Webster RN, [name] [phone] Spoke with pt at bedside- per conversation pt states that she still drives short distances- she has someone come in and clean for her but she does fix her own meals- PT eval still pending will follow for recommendations- pt reports-she uses cane but has a walker also.- plan is to have daughter stay with her for a few days at discharge.

## 2014-07-26 NOTE — ED Notes (Addendum)
Per EMS pt discharged today from [facility];diagnosed with hiatal hernia. Pt reports severe LUQ and n/v continued since discharge. NSR on monitor.  Pt given 4 mg zofran IV en route with EMS.

## 2014-07-26 NOTE — ED Notes (Signed)
Family at bedside. 

## 2014-07-26 NOTE — Progress Notes (Signed)
DC home with daughter, verbally understands DC instructions, no questions asked

## 2014-07-26 NOTE — Progress Notes (Signed)
CARE MANAGEMENT NOTE 11/23/2013  Patient:  [name],[name]   Account Number:  [id number]  Date Initiated:  07/22/2014  Documentation initiated by:  Donn PieriniWEBSTER,KRISTI  Subjective/Objective Assessment:   Pt admitted with UGIB/bloody emesis     Action/Plan:   PTA pt lived at home- NCM to follow for d/c needs   Anticipated DC Date:  07/24/2014   Anticipated DC Plan:  HOME W HOME HEALTH SERVICES      DC Planning Services  CM consult      Choice offered to / List presented to:          [facility]  [facility]  [facility]      [facility]   Status of service:  Completed, signed off Medicare Important Message given?  YES (If response is "NO", the following Medicare IM given date fields will be blank) Date Medicare IM given:  07/23/2014 Medicare IM given by:  Donn PieriniWEBSTER,KRISTI Date Additional Medicare IM given:   Additional Medicare IM given by:    Discharge Disposition:  HOME W HOME HEALTH SERVICES  Per UR Regulation:  Reviewed for med. necessity/level of care/duration of stay  If discussed at Long Length of Stay Meetings, dates discussed:    Comments:  11/23/2013 1340 NCM contacted pt [facility]. States pt has RW and other DME at home from previous injury. Contacted [facility] for referral. Contact to arrange HH is dtr, [name] # [phone]. Isidoro DonningAlesia Jabes Primo RN CCM Case Mgmt phone [phone]  11/23/2013 1154 NCM left message on dtr's, [name] [phone]. HH PT needed. Waiting for call back [facility] Amedeo Kinsmanleisa Mark Benecke [facility] Mg[facility] phone [phone]  07/23/14- 1200- Donn PieriniKristi Webster RN, BSN [phone] Spoke with pt at bedside- per conversation pt states that she still drives short distances- she has someone come in and clean for her but she does fix her own meals- PT eval still pending will follow for recommendations- pt reports-she uses cane but has a walker also.- plan is to have daughter stay with her for a few days at  discharge.

## 2014-07-26 NOTE — H&P (Addendum)
[facility] History and Physical  [name] ZOX:[id number] DOB: 1918/04/30 DOA: 07/17/2014  Referring physician: ED physician PCP: Freddy Jaksch, MD   Chief Complaint: abdominal pain with nausea and vomiting   HPI:  Pt is 80 year old female with history of GERD, hiatal hernia, moderate aortic stenosis, hypertension, just discharged from [facility] hospital earlier in the day after being hospitalized for GI bleed, hematemesis, partial gastric volvulus (determined not to be surgical candidate). Now presented to [facility] ED with main concern of worsening epigastric pain, constant and throbbing, 10/10 in severity, non radiating, worse with drinking fluids or eating solids, no specific alleviating factors. Please note that pt vomiting at the time of the interview and due to mild distress she is unable to provide history. Her family at bedside is able to provide history. They have noticed non bloody vomiting since being discharged earlier today, passing flatus but no Bm for 2 days.No reported fevers, chills, chest pain, dyspnea, no reported urinary concerns.    In ED, pt noted to be in mild distress due to vomiting, VSS overall stable, blood work notable for WBC 12.8 (which is down from yesterday 13.6), Hg 12.7 (up from 10.8 on Oct 11th, 2015). [facility] asked to admit for further evaluation and [facility] consulted ( Dr. Matthias Hughs).   Assessment and Plan: Principal Problem:   Abdominal pain with nausea and non bloody vomiting - possibly related to persistent or even worsening volvulus, ? Also Prednisone use  (pt started on Prednisone for heal pain) - Gi consulted, recommendation is to proceed with CT abd and pelvis - also recommendation is to obtain  upper GI series in AM for further evaluation - keep NPO, provide IVF, analgesia and antiemetics as needed  Active Problems:   Leukocytosis - Pt was started on prednisone on previous admission for possible gout so leukocytosis probably from steroids -  overall trending down, will repeat CBC in AM   Hypokalemia - secondary to vomiting - supplemented and will repeat BMP in AM   Dehydration - evident on physical exam and per CBC (hemoconcentration) - will provide IVF and will repeat CBC in AM   HTN (hypertension)   Paraesophageal hernia with partial volvulus; resolved  - recent EGD with  large hiatal hernia with probable partial volvulus - per recent general surgery rec's pt is not surgical candidate and conservative management recommended   Severe PCM - secondary to persistent illness indicated above - keep NPO for now until GI sees    HTN, accelerated - pt is not on any antihypertensive regimen at home - will place on IV hydralazine scheduled and as needed - change to PO once pt able to tolerate    Right and left feet pain - Unclear etiology possibly plantar fasciitis versus gout vs neuropathic - pt placed on Prednisone on last hospitalization per ortho rec's - will taper down as it could be contributing to vomiting   SCD's for DVT prophylaxis given recent hematemesis   Radiological Exams on Admission: No results found.   Code Status: Full Family Communication: Pt at bedside Disposition Plan: Admit for further evaluation     Review of Systems:  Constitutional: Negative for diaphoresis.  HENT: Negative for hearing loss, ear pain, nosebleeds, congestion, sore throat, neck pain, tinnitus and ear discharge.   Eyes: Negative for blurred vision, double vision, photophobia, pain, discharge and redness.  Respiratory: Negative for cough, hemoptysis, sputum production, shortness of breath, wheezing and stridor.   Cardiovascular: Negative for chest pain, palpitations, orthopnea, claudication  and leg swelling.  Gastrointestinal: Per HPI  Genitourinary: Negative for dysuria, urgency, frequency, hematuria and flank pain.  Musculoskeletal: Negative for myalgias, back pain, joint pain and falls.  Skin: Negative for itching and rash.   Neurological: Negative for dizziness and weakness Endo/Heme/Allergies: Negative for environmental allergies and polydipsia. Does not bruise/bleed easily.  Psychiatric/Behavioral: Negative for suicidal ideas. The patient is not nervous/anxious.      Past Medical History  Diagnosis Date  . GERD (gastroesophageal reflux disease)   . Hyperlipidemia   . Moderate aortic stenosis   . HTN (hypertension)   . Heart murmur   . Hernia, epigastric     Past Surgical History  Procedure Laterality Date  . Appendectomy    . Cataract surgery    . Cholecystectomy    . Anterior cervical decomp/discectomy fusion    . Esophagogastroduodenoscopy N/A 07/21/2014    Procedure: ESOPHAGOGASTRODUODENOSCOPY (EGD);  Surgeon: Petra KubaMarc E Magod, MD;  [facility];  Service: Endoscopy;  Laterality: N/A;    Social History:  reports that she has never smoked. She does not have any smokeless tobacco history on file. She reports that she does not drink alcohol. Her drug history is not on file.  Allergies  Allergen Reactions  . Codeine Nausea Only    Family History  Problem Relation Age of Onset  . Coronary artery disease      Postive in family    Prior to Admission medications   Medication Sig Start Date End Date Taking? Authorizing Provider  dextromethorphan-guaiFENesin (MUCINEX DM) 30-600 MG per 12 hr tablet Take 1 tablet by mouth daily as needed for cough (cough).    Yes Historical Provider, MD  Docusate Sodium (DSS) 100 MG CAPS Take 100 mg by mouth 2 (two) times daily as needed (constipation). Jun 29, 2014  Yes Ripudeep Jenna LuoK Rai, MD  loratadine (CLARITIN) 10 MG tablet Take 10 mg by mouth daily as needed for allergies (allergies).    Yes Historical Provider, MD  pantoprazole (PROTONIX) 40 MG tablet Take 1 tablet (40 mg total) by mouth 2 (two) times daily before a meal. Jun 29, 2014  Yes Ripudeep K Rai, MD  pregabalin (LYRICA) 75 MG capsule Take 1 capsule (75 mg total) by mouth 2 (two) times daily. Jun 29, 2014  Yes  Ripudeep Jenna LuoK Rai, MD  Simethicone (GAS-X EXTRA STRENGTH PO) Take 1 tablet by mouth daily as needed (gas).    Yes Historical Provider, MD  predniSONE (DELTASONE) 10 MG tablet Prednisone dosing: Take  Prednisone 40mg  (4 tabs) x 1 days, then taper to 30mg  (3 tabs) x 2  days, then 20mg  (2 tabs) x 2days, then 10mg  (1 tab) x 2 days, then OFF. 07/27/14   Ripudeep K Rai, MD  traMADol (ULTRAM) 50 MG tablet Take 1 tablet (50 mg total) by mouth every 6 (six) hours as needed for moderate pain. Jun 29, 2014   Ripudeep Jenna LuoK Rai, MD    Physical Exam: Filed Vitals:   Jun 29, 2014 1824 Jun 29, 2014 1830 Jun 29, 2014 2056  BP:  163/77 195/77  Pulse:  77 57  Temp:  98.9 F (37.2 C)   TempSrc:  Oral   Resp:  18 20  SpO2: 94% 95% 95%    Physical Exam  Constitutional: Appears well-developed and well-nourished. In mild distress due to vomiting  HENT: Normocephalic. External right and left ear normal. Dry MM Eyes: Conjunctivae and EOM are normal. PERRLA, no scleral icterus.  Neck: Normal ROM. Neck supple. No JVD. No tracheal deviation. No thyromegaly.  CVS: RRR, S1/S2 +, SEM 2/6, no gallops,  no carotid bruit.  Pulmonary: Effort and breath sounds normal, no stridor, rhonchi, wheezes, rales.  Abdominal: Soft. BS +,  no distension, tenderness in epigastric area, no rebound or guarding.  Musculoskeletal: Normal range of motion.  Lymphadenopathy: No lymphadenopathy noted, cervical, inguinal. Neuro: Alert. Normal reflexes, muscle tone coordination. No cranial nerve deficit. Skin: Skin is warm and dry. No rash noted. Not diaphoretic. No erythema. No pallor.  Psychiatric: Normal mood and affect.  Labs on Admission:  Basic Metabolic Panel:  Recent Labs Lab 07/21/14 0223 07/23/14 0241 07/24/14 0340 07/25/14 0530 07/15/2014 1938  NA 141 145 142 141 141  K 4.1 3.0* 3.3* 3.8 3.3*  CL 98 106 104 105 101  CO2 26 27 26 23 23   GLUCOSE 172* 92 104* 111* 133*  BUN 15 9 7 8 15   CREATININE 0.73 0.85 0.80 0.72 0.68  CALCIUM 9.7 7.6*  8.1* 8.3* 9.1   Liver Function Tests:  Recent Labs Lab 07/21/14 0223 07/27/2014 1938  AST 18 18  ALT 14 19  ALKPHOS 76 68  BILITOT 0.3 0.2*  PROT 8.3 7.6  ALBUMIN 4.1 3.1*    Recent Labs Lab 07/21/14 0223 08/08/2014 1938  LIPASE 18 12   CBC:  Recent Labs Lab 07/21/14 0223 07/21/14 1252 07/23/14 0241 07/24/14 0340 07/25/14 0530 07/15/2014 1938  WBC 12.9* 17.0* 14.5* 14.2* 13.6* 12.8*  NEUTROABS 11.5*  --   --   --   --  11.0*  HGB 14.5 12.5 10.3* 10.8* 10.8* 12.7  HCT 42.9 37.2 31.7* 32.4* 32.4* 37.9  MCV 91.1 92.1 91.9 91.3 90.8 90.2  PLT 258 246 220 247 263 354   Cardiac Enzymes:  Recent Labs Lab 07/21/14 0223  TROPONINI <0.30     Debbora PrestoMAGICK-Kingslee Mairena, MD  [facility] Pager [phone]  If 7PM-7AM, please contact night-coverage [email or web address] Password [email or web address] 08/10/2014, 11:00 PM

## 2014-07-26 NOTE — ED Notes (Signed)
Placed pt on bedpan per her request. Pt unable to urinate at this time.

## 2014-07-26 NOTE — ED Notes (Signed)
Pt is aware of the need for urine sample.  

## 2014-07-26 NOTE — ED Notes (Signed)
Pt knocked over cup of contrast for CT, radiology made aware

## 2014-07-27 DIAGNOSIS — K449 Diaphragmatic hernia without obstruction or gangrene: Secondary | ICD-10-CM

## 2014-07-27 DIAGNOSIS — K562 Volvulus: Secondary | ICD-10-CM

## 2014-07-27 LAB — URINALYSIS, ROUTINE W REFLEX MICROSCOPIC
Bilirubin Urine: NEGATIVE
Glucose, UA: NEGATIVE mg/dL
HGB URINE DIPSTICK: NEGATIVE
Ketones, ur: NEGATIVE mg/dL
Nitrite: NEGATIVE
PROTEIN: NEGATIVE mg/dL
SPECIFIC GRAVITY, URINE: 1.018 (ref 1.005–1.030)
Urobilinogen, UA: 0.2 mg/dL (ref 0.0–1.0)
pH: 6.5 (ref 5.0–8.0)

## 2014-07-27 LAB — GLUCOSE, CAPILLARY: GLUCOSE-CAPILLARY: 135 mg/dL — AB (ref 70–99)

## 2014-07-27 LAB — PROTIME-INR
INR: 1.02 (ref 0.00–1.49)
Prothrombin Time: 13.5 seconds (ref 11.6–15.2)

## 2014-07-27 LAB — CBC
HCT: 37 % (ref 36.0–46.0)
HEMOGLOBIN: 12.7 g/dL (ref 12.0–15.0)
MCH: 30.8 pg (ref 26.0–34.0)
MCHC: 34.3 g/dL (ref 30.0–36.0)
MCV: 89.6 fL (ref 78.0–100.0)
Platelets: 338 10*3/uL (ref 150–400)
RBC: 4.13 MIL/uL (ref 3.87–5.11)
RDW: 13.9 % (ref 11.5–15.5)
WBC: 12.9 10*3/uL — ABNORMAL HIGH (ref 4.0–10.5)

## 2014-07-27 LAB — URINE MICROSCOPIC-ADD ON

## 2014-07-27 LAB — PHOSPHORUS: PHOSPHORUS: 2.9 mg/dL (ref 2.3–4.6)

## 2014-07-27 LAB — COMPREHENSIVE METABOLIC PANEL
ALBUMIN: 3 g/dL — AB (ref 3.5–5.2)
ALK PHOS: 65 U/L (ref 39–117)
ALT: 18 U/L (ref 0–35)
AST: 15 U/L (ref 0–37)
Anion gap: 15 (ref 5–15)
BUN: 13 mg/dL (ref 6–23)
CALCIUM: 8.7 mg/dL (ref 8.4–10.5)
CO2: 26 mEq/L (ref 19–32)
Chloride: 98 mEq/L (ref 96–112)
Creatinine, Ser: 0.68 mg/dL (ref 0.50–1.10)
GFR calc Af Amer: 84 mL/min — ABNORMAL LOW (ref >90)
GFR calc non Af Amer: 73 mL/min — ABNORMAL LOW (ref >90)
GLUCOSE: 117 mg/dL — AB (ref 70–99)
POTASSIUM: 3.2 meq/L — AB (ref 3.7–5.3)
SODIUM: 139 meq/L (ref 137–147)
TOTAL PROTEIN: 7.1 g/dL (ref 6.0–8.3)
Total Bilirubin: 0.3 mg/dL (ref 0.3–1.2)

## 2014-07-27 LAB — MAGNESIUM: MAGNESIUM: 2 mg/dL (ref 1.5–2.5)

## 2014-07-27 MED ORDER — HYDRALAZINE HCL 20 MG/ML IJ SOLN
5.0000 mg | Freq: Four times a day (QID) | INTRAMUSCULAR | Status: DC | PRN
  Administered 2014-07-27: 5 mg via INTRAVENOUS
  Filled 2014-07-27: qty 1

## 2014-07-27 MED ORDER — HYDRALAZINE HCL 25 MG PO TABS
25.0000 mg | ORAL_TABLET | Freq: Four times a day (QID) | ORAL | Status: DC | PRN
  Administered 2014-07-27: 25 mg via ORAL
  Filled 2014-07-27: qty 1

## 2014-07-27 MED ORDER — AMLODIPINE BESYLATE 10 MG PO TABS
10.0000 mg | ORAL_TABLET | Freq: Every day | ORAL | Status: DC
  Filled 2014-07-27 (×2): qty 1

## 2014-07-27 MED ORDER — CETYLPYRIDINIUM CHLORIDE 0.05 % MT LIQD
7.0000 mL | Freq: Two times a day (BID) | OROMUCOSAL | Status: DC
  Administered 2014-07-27: 7 mL via OROMUCOSAL

## 2014-07-27 MED ORDER — SODIUM CHLORIDE 0.9 % IV SOLN
INTRAVENOUS | Status: DC
  Administered 2014-07-27: 18:00:00 via INTRAVENOUS

## 2014-07-27 MED ORDER — PREDNISONE 20 MG PO TABS
40.0000 mg | ORAL_TABLET | Freq: Every day | ORAL | Status: AC
  Administered 2014-07-27: 40 mg via ORAL
  Filled 2014-07-27: qty 2

## 2014-07-27 MED ORDER — PREDNISONE 20 MG PO TABS: 20.0000 mg | ORAL_TABLET | Freq: Every day | ORAL | Status: DC

## 2014-07-27 MED ORDER — PROMETHAZINE HCL 25 MG/ML IJ SOLN
12.5000 mg | Freq: Four times a day (QID) | INTRAMUSCULAR | Status: DC | PRN
  Administered 2014-07-27: 12.5 mg via INTRAVENOUS
  Filled 2014-07-27: qty 1

## 2014-07-27 MED ORDER — PREDNISONE 20 MG PO TABS
30.0000 mg | ORAL_TABLET | Freq: Every day | ORAL | Status: DC
  Filled 2014-07-27: qty 1

## 2014-07-27 MED ORDER — PREDNISONE 20 MG PO TABS: 10.0000 mg | ORAL_TABLET | Freq: Every day | ORAL | Status: DC

## 2014-07-27 NOTE — Progress Notes (Signed)
Subjective: Paraesophageal hiatal hernia with obstruction/Gastric volvulus resolved and felt to be long standing when she was seen last week.(consult 07/21/14)  She was not interested in surgery when seen at that time.  He symptoms resolved and she was discharged home yesterday.  Unfortunately she got home and started having pain and vomiting again.  CT scan shows large paraesophageal hiatal hernia with obstruction at the pylorus.  She had a similar finding back in 08/2010.  UGI is ordered, she has been seen by Dr. Matthias HughsBuccini and he thinks she is now a candidate for surgery.  She is not vomiting now but is afraid to put down green bag because she is afraid she will vomit again. She notes pain is mid epigastric and left anterior and lateral chest.   Objective: Vital signs in last 24 hours: Temp:  [98.2 F (36.8 C)-98.9 F (37.2 C)] 98.2 F (36.8 C) (10/13 0049) Pulse Rate:  [57-83] 82 (10/13 0049) Resp:  [18-20] 20 (10/13 0049) BP: (163-195)/(72-80) 179/74 mmHg (10/13 0049) SpO2:  [93 %-95 %] 95 % (10/13 0049) Weight:  [69.7 kg (153 lb 10.6 oz)] 69.7 kg (153 lb 10.6 oz) (10/13 0049) Last BM Date: 07/25/14 NPO Afebrile, BP up some K+ low at 3.2, WBC stable from before discharge. Intake/Output from previous day: 10/12 0701 - 10/13 0700 In: 495 [I.V.:295; IV Piggyback:200] Out: 550 [Urine:550] Intake/Output this shift:    General appearance: alert, cooperative and mild distress Head: atraumatic Resp: clear to auscultation bilaterally Cardio: systolic murmur: systolic ejection 3/6, GI: soft non tender, + BS Extremities: extremities normal, atraumatic, no cyanosis or edema  Lab Results:   Recent Labs  08/14/2014 1938 07/27/14 0415  WBC 12.8* 12.9*  HGB 12.7 12.7  HCT 37.9 37.0  PLT 354 338    BMET  Recent Labs  07/17/2014 1938 07/27/14 0415  NA 141 139  K 3.3* 3.2*  CL 101 98  CO2 23 26  GLUCOSE 133* 117*  BUN 15 13  CREATININE 0.68 0.68  CALCIUM 9.1 8.7    PT/INR  Recent Labs  07/27/14 0810  LABPROT 13.5  INR 1.02     Recent Labs Lab 07/21/14 0223 08/11/2014 1938 07/27/14 0415  AST 18 18 15   ALT 14 19 18   ALKPHOS 76 68 65  BILITOT 0.3 0.2* 0.3  PROT 8.3 7.6 7.1  ALBUMIN 4.1 3.1* 3.0*     Lipase     Component Value Date/Time   LIPASE 12 07/27/2014 1938     Studies/Results: Ct Abdomen Pelvis W Contrast  07/27/2014   CLINICAL DATA:  Recurrent upper abdominal pain and vomiting with recent upper GI bleed and partial gastric volvulus.  EXAM: CT ABDOMEN AND PELVIS WITH CONTRAST  TECHNIQUE: Multidetector CT imaging of the abdomen and pelvis was performed using the standard protocol following bolus administration of intravenous contrast.  CONTRAST:  50mL OMNIPAQUE IOHEXOL 300 MG/ML SOLN, 100mL OMNIPAQUE IOHEXOL 300 MG/ML SOLN  COMPARISON:  09/07/2010  FINDINGS: BODY Bernasconi: Unremarkable.  LOWER CHEST: There is paraesophageal herniation which has been previously demonstrated (09/07/2010 CT and 09/11/2010 esophagram). Portions of the distal stomach are not imaged due to high herniation into the chest. As before, the stomach is distended with fluid, with relative decompression of small bowel. This is likely obstruction at the proximal duodenum which at the level of the hiatus. There is no evidence of gastric necrosis or perforation. The orientation of the stomach is unchanged (ie no acute volvulus).  Mitral and aortic annular calcification. There are small  bilateral pleural effusions which appears simple. Dependent atelectasis bilaterally.  ABDOMEN/PELVIS:  Liver: No focal abnormality.  Biliary: Cholecystectomy with chronic enlargement of the biliary tree, likely reservoir effect.  Pancreas: Atrophy without ductal enlargement or visible mass.  Spleen: Small size.  No focal abnormality.  Adrenals: Unremarkable.  Kidneys and ureters: No hydronephrosis or stone.  Bladder: Low positioning consistent with pelvic floor laxity.  Reproductive:  Hysterectomy.  No adnexal mass.  Bowel: No obstruction. No pericecal inflammation.  Peritoneum: No ascites or pneumoperitoneum.  Vascular: No acute abnormality.  OSSEOUS: No acute abnormalities.  IMPRESSION: Chronic, large paraesophageal hiatal hernia with obstruction at the pylorus. Similar findings noted by CT 09/07/2010.   Electronically Signed   By: Tiburcio PeaJonathan  Watts M.D.   On: 07/27/2014 00:39    Medications: . antiseptic oral rinse  7 mL Mouth Rinse BID  . pantoprazole (PROTONIX) IV  40 mg Intravenous Q12H  . [START ON 07/30/2014] predniSONE  10 mg Oral Q breakfast  . [START ON 08/07/2014] predniSONE  20 mg Oral Q breakfast  . [START ON 07/30/2014] predniSONE  30 mg Oral Q breakfast  . pregabalin  75 mg Oral BID  . sodium chloride  3 mL Intravenous Q12H    Prior to Admission medications   Medication Sig Start Date End Date Taking? Authorizing Provider  dextromethorphan-guaiFENesin (MUCINEX DM) 30-600 MG per 12 hr tablet Take 1 tablet by mouth daily as needed for cough (cough).    Yes Historical Provider, MD  Docusate Sodium (DSS) 100 MG CAPS Take 100 mg by mouth 2 (two) times daily as needed (constipation). 07/25/2014  Yes Ripudeep Jenna LuoK Rai, MD  loratadine (CLARITIN) 10 MG tablet Take 10 mg by mouth daily as needed for allergies (allergies).    Yes Historical Provider, MD  pantoprazole (PROTONIX) 40 MG tablet Take 1 tablet (40 mg total) by mouth 2 (two) times daily before a meal. 07/27/2014  Yes Ripudeep K Rai, MD  pregabalin (LYRICA) 75 MG capsule Take 1 capsule (75 mg total) by mouth 2 (two) times daily. 07/20/2014  Yes Ripudeep Jenna LuoK Rai, MD  Simethicone (GAS-X EXTRA STRENGTH PO) Take 1 tablet by mouth daily as needed (gas).    Yes Historical Provider, MD  predniSONE (DELTASONE) 10 MG tablet Prednisone dosing: Take  Prednisone 40mg  (4 tabs) x 1 days, then taper to 30mg  (3 tabs) x 2  days, then 20mg  (2 tabs) x 2days, then 10mg  (1 tab) x 2 days, then OFF. 07/27/14   Ripudeep K Rai, MD  traMADol (ULTRAM)  50 MG tablet Take 1 tablet (50 mg total) by mouth every 6 (six) hours as needed for moderate pain. 07/23/2014   Ripudeep Jenna LuoK Rai, MD     Assessment/Plan 1.  Paraesophageal hiatal hernia with obstruction around the duodenum going thru the diaphragm. 2.  Hospitalized 10/7-10/12 for paraesophageal hernia, hematemesis, GI bleed. 3.  GERD 4.  Moderate Aortic stenosis 5.  Hypertension 6.  Prior back surgery 7.  Hx of cholecystectomy/appendectomy 8.  Malnutrition/decondtioning.  Plan:  Dr. Michaell CowingGross will have to see her and review, for final decision; but if she can be cleared by Medicine and Cardiology he will try to fix this sometime this week. She has not had her UGI and she still feels nauseated.  She would benefit from NG placement.  We will follow with you.         LOS: 1 day    Adit Riddles 07/27/2014

## 2014-07-27 NOTE — Care Management Note (Signed)
    Page 1 of 2   07/27/2014     7:03:09 PM CARE MANAGEMENT NOTE 07/27/2014  Patient:  [name],[name]   Account Number:  [id number]  Date Initiated:  07/22/2014  Documentation initiated by:  Donn PieriniWEBSTER,KRISTI  Subjective/Objective Assessment:   Pt admitted with UGIB/bloody emesis     Action/Plan:   PTA pt lived at home- NCM to follow for d/c needs   Anticipated DC Date:  05-13-2014   Anticipated DC Plan:  HOME W HOME HEALTH SERVICES      DC Planning Services  CM consult      Choice offered to / List presented to:          [facility]  [facility]  [facility]3 OT      [facility]   Status of service:  Completed, signed off Medicare Important Message given?  YES (If response is "NO", the following Medicare IM given date fields will be blank) Date Medicare IM given:  07/23/2014 Medicare IM given by:  Donn PieriniWEBSTER,KRISTI Date Additional Medicare IM given:  07/22/2014 Additional Medicare IM given by:  Isidoro DonningALESIA SHAVIS  [facility]position[facility]  [facility]  Per UR Regulation:  Reviewed for med. necessity/level of care/duration of stay  If discussed at Long Length of Stay Meetings, dates discussed:    Comments:  08/13/2014 1340 NCM contacted pt and [facility]. States pt has RW and other DME at home from previous injury. Contacted [facility] for referral. Contact to arrange HH is dtr, Hubbard HartshornMary Patton # [phone]. Isidoro DonningAlesia Shavis RN CCM Case Mgmt phone [phone]  08/04/2014 1154 NCM left message on dtr's, Hubbard HartshornMary Patton [phone]. HH PT needed. Waiting for call back to [facility]. Amedeo Kinsmanleisa Shavis [facility] Case Mgmt phone [phone]  07/23/14- 1200- Donn PieriniKristi Webster RN, BSN [phone] Spoke with pt at bedside- per conversation pt states that she still drives short distances- she has someone come in and clean for her but she does fix her own meals- PT eval still pending will follow for recommendations- pt reports-she uses cane but has a walker also.-  plan is to have daughter stay with her for a few days at discharge.

## 2014-07-27 NOTE — Care Management Note (Signed)
    Page 1 of 1   07/27/2014     1:09:50 PM CARE MANAGEMENT NOTE 07/27/2014  Patient:  Hazel SamsWALL,Kadeshia E   Account Number:  [id number]  Date Initiated:  07/27/2014  Documentation initiated by:  [facility] Center[facility]HABIR,Shawana Knoch  Subjective/Objective Assessment:   80 Y/O F ADMITTED W/ABD PAIN,N/V.READMIT-10/7-10/12-HIATAL HERNIA.     Action/Plan:   FROM HOME, ALONE.HAS PCP,PHARMACY.   Anticipated DC Date:  07/30/2014   Anticipated DC Plan:  HOME/SELF CARE      DC Planning Services  CM consult      Choice offered to / List presented to:             Status of service:  In process, will continue to follow Medicare Important Message given?   (If response is "NO", the following Medicare IM given date fields will be blank) Date Medicare IM given:   Medicare IM given by:   Date Additional Medicare IM given:   Additional Medicare IM given by:    Discharge Disposition:    Per UR Regulation:  Reviewed for med. necessity/level of care/duration of stay  If discussed at Long Length of Stay Meetings, dates discussed:    Comments:  07/27/14 Saniyah Mondesir RN,BSN NCM [phone] GI FOLLOWING.WOULD RECOMMEND PT/OT CONS WHEN APPROPRIATE.

## 2014-07-27 NOTE — Progress Notes (Addendum)
Patient with worsening paraesophageal hiatal hernia.  Most of the upper stomach and now distal stomach/duodenal bulb herniating up in the chest.  Going going to your tolerance with recurrent episodes of nausea vomiting pain.  Understandingly, she afraid to have any surgery.  I would want to get medical and cardiac clearance first.  However, I do not see any other good options to fix this problem aside from an operation.  I will discuss with her in more detail once we get a sense of her cardiac risk.  Good exercise tolerance and independence makes me more willing to offer laparoscopic surgery to fix this so that she can get on with her life.  She is not severely demented.  She is not severely bedridden.  I see no advantage in ordering an UGI given we know she has a giant PEH/Hiatal hernia by UGI 2011 & CT scan the past few years - no larger.  Aspiration risk.  NPO for for.  NGT if possible but not able to pass yet  Ardeth SportsmanSteven C. Neldon Shepard, M.D., F.A.C.S. Gastrointestinal and Minimally Invasive Surgery [facility] [address] [place], [place] [id number] [phone] Main / Paging

## 2014-07-27 NOTE — Progress Notes (Signed)
[facility] PROGRESS NOTE  Vernell Leepollyanna E Searfoss FAO:[id number]:[id number] DOB: [birth year] DOA: 07/15/2014 PCP: Freddy JakschUTLAW,WILLIAM M, MD  Assessment/Plan: 1. Paraesophageal hernia with partial volvulus- seen by GI this morning, Dr. Matthias HughsBuccini discussed the surgery, for a surgical option. Surgery to follow. Continue protonix 40 mg IV every 12 hours. 2. Leukocytosis- secondary to prednisone taper, patient was discharged on 08/02/2014, we'll continue the prednisone taper. She has been afebrile in the hospital. 3. Dehydration- continue with IV normal saline. 4. Hypertension- patient is not on any antidepressive regimen, we'll start Norvasc 10 mg. Hydralazine 25 mg by mouth every 6 hours when necessary for BP greater than 160/100. 5. DVT prophylaxis- SCDs  Code Status: Full code Family Communication: *Spoke to the daughter at bedside Disposition Plan: Home when  stable*   Consultants:  GI    Procedures:  None  Antibiotics:  None  HPI/Subjective: 80 year old female with history of GERD, hiatal hernia, moderate aortic stenosis, hypertension, [facility] earlier in the day after being hospitalized for GI bleed, hematemesis, partial gastric volvulus (determined not to be surgical candidate). Now [facility] with main concern of worsening epigastric pain, constant and throbbing, 10/10 in severity, non radiating, worse with drinking fluids or eating solids, no specific alleviating factors. Please note that pt vomiting at the time of the interview and due to mild distress she is unable to provide history. Her family at bedside is able to provide history. They have noticed non bloody vomiting since being discharged earlier today, passing flatus but no Bm for 2 days.No reported fevers, chills, chest pain, dyspnea, no reported urinary concerns.   Patient continues to have epigastric pain this morning, seen by GI.  Objective: Filed Vitals:   07/27/14 1449  BP: 193/77  Pulse: 77   Temp: 98.2 F (36.8 C)  Resp: 20    Intake/Output Summary (Last 24 hours) at 07/27/14 1630 Last data filed at 07/27/14 1300  Gross per 24 hour  Intake    795 ml  Output    750 ml  Net     45 ml   Filed Weights   07/27/14 0049  Weight: 69.7 kg (153 lb 10.6 oz)    Exam:  Physical Exam: Head: Normocephalic, atraumatic.  Eyes: No signs of jaundice, EOMI Lungs: Normal respiratory effort. B/L Clear to auscultation, no crackles or wheezes.  Heart: Regular RR. S1 and S2 normal  Abdomen: BS normoactive. Soft, Nondistended, non-tender.  Extremities: No pretibial edema, no erythema  Data Reviewed: Basic Metabolic Panel:  Recent Labs Lab 07/23/14 0241 07/24/14 0340 07/25/14 0530 08/01/2014 1938 07/27/14 0415 07/27/14 0810  NA 145 142 141 141 139  --   K 3.0* 3.3* 3.8 3.3* 3.2*  --   CL 106 104 105 101 98  --   CO2 27 26 23 23 26   --   GLUCOSE 92 104* 111* 133* 117*  --   BUN 9 7 8 15 13   --   CREATININE 0.85 0.80 0.72 0.68 0.68  --   CALCIUM 7.6* 8.1* 8.3* 9.1 8.7  --   MG  --   --   --   --   --  2.0  PHOS  --   --   --   --   --  2.9   Liver Function Tests:  Recent Labs Lab 07/21/14 0223 07/25/2014 1938 07/27/14 0415  AST 18 18 15   ALT 14 19 18   ALKPHOS 76 68 65  BILITOT 0.3 0.2* 0.3  PROT  8.3 7.6 7.1  ALBUMIN 4.1 3.1* 3.0*    Recent Labs Lab 07/21/14 0223 08/12/2014 1938  LIPASE 18 12   No results found for this basename: AMMONIA,  in the last 168 hours CBC:  Recent Labs Lab 07/21/14 0223  07/23/14 0241 07/24/14 0340 07/25/14 0530 08/09/2014 1938 07/27/14 0415  WBC 12.9*  < > 14.5* 14.2* 13.6* 12.8* 12.9*  NEUTROABS 11.5*  --   --   --   --  11.0*  --   HGB 14.5  < > 10.3* 10.8* 10.8* 12.7 12.7  HCT 42.9  < > 31.7* 32.4* 32.4* 37.9 37.0  MCV 91.1  < > 91.9 91.3 90.8 90.2 89.6  PLT 258  < > 220 247 263 354 338  < > = values in this interval not displayed. Cardiac Enzymes:  Recent Labs Lab 07/21/14 0223  TROPONINI <0.30   BNP (last 3  results) No results found for this basename: PROBNP,  in the last 8760 hours CBG:  Recent Labs Lab 07/27/14 0802  GLUCAP 135*    Recent Results (from the past 240 hour(s))  URINE CULTURE     Status: None   Collection Time    07/21/14  3:35 AM      Result Value Ref Range Status   Specimen Description URINE, CLEAN CATCH   Final   Special Requests ADDED [id number]   Final   Culture  Setup Time     Final   Value: 07/21/2014 11:30     Performed at [facility] Count     Final   Value: >=100,000 COLONIES/ML     Performed at [facility]   Culture     Final   Value: Multiple bacterial morphotypes present, none predominant. Suggest appropriate recollection if clinically indicated.     Performed at [facility]   Report Status 07/22/2014 FINAL   Final  MRSA PCR SCREENING     Status: None   Collection Time    07/21/14  8:51 PM      Result Value Ref Range Status   MRSA by PCR NEGATIVE  NEGATIVE Final   Comment:            The GeneXpert MRSA Assay (FDA     approved for NASAL specimens     only), is one component of a     comprehensive MRSA colonization     surveillance program. It is not     intended to diagnose MRSA     infection nor to guide or     monitor treatment for     MRSA infections.     Studies: Ct Abdomen Pelvis W Contrast  07/27/2014   CLINICAL DATA:  Recurrent upper abdominal pain and vomiting with recent upper GI bleed and partial gastric volvulus.  EXAM: CT ABDOMEN AND PELVIS WITH CONTRAST  TECHNIQUE: Multidetector CT imaging of the abdomen and pelvis was performed using the standard protocol following bolus administration of intravenous contrast.  CONTRAST:  50mL OMNIPAQUE IOHEXOL 300 MG/ML SOLN, OMNIPAQUE IOHEXOL 300 MG/ML SOLN  COMPARISON:  09/07/2010  FINDINGS: BODY Remer: Unremarkable.  LOWER CHEST: There is paraesophageal herniation which has been previously demonstrated (09/07/2010 CT and 09/11/2010 esophagram). Portions  of the distal stomach are not imaged due to high herniation into the chest. As before, the stomach is distended with fluid, with relative decompression of small bowel. This is likely obstruction at the proximal duodenum which at the level of the hiatus.  There is no evidence of gastric necrosis or perforation. The orientation of the stomach is unchanged (ie no acute volvulus).  Mitral and aortic annular calcification. There are small bilateral pleural effusions which appears simple. Dependent atelectasis bilaterally.  ABDOMEN/PELVIS:  Liver: No focal abnormality.  Biliary: Cholecystectomy with chronic enlargement of the biliary tree, likely reservoir effect.  Pancreas: Atrophy without ductal enlargement or visible mass.  Spleen: Small size.  No focal abnormality.  Adrenals: Unremarkable.  Kidneys and ureters: No hydronephrosis or stone.  Bladder: Low positioning consistent with pelvic floor laxity.  Reproductive: Hysterectomy.  No adnexal mass.  Bowel: No obstruction. No pericecal inflammation.  Peritoneum: No ascites or pneumoperitoneum.  Vascular: No acute abnormality.  OSSEOUS: No acute abnormalities.  IMPRESSION: Chronic, large paraesophageal hiatal hernia with obstruction at the pylorus. Similar findings noted by CT 09/07/2010.   Electronically Signed   By: Tiburcio PeaJonathan  Watts M.D.   On: 07/27/2014 00:39    Scheduled Meds: . antiseptic oral rinse  7 mL Mouth Rinse BID  . pantoprazole (PROTONIX) IV  40 mg Intravenous Q12H  . [START ON 07/30/2014] predniSONE  10 mg Oral Q breakfast  . [START ON 07/15/2014] predniSONE  20 mg Oral Q breakfast  . [START ON 08/07/2014] predniSONE  30 mg Oral Q breakfast  . pregabalin  75 mg Oral BID  . sodium chloride  3 mL Intravenous Q12H   Continuous Infusions:   Principal Problem:   Abdominal pain Active Problems:   HTN (hypertension)   Leukocytosis   Hypokalemia    Time spent: 25 min    [facility] Hospital[facility],Pandora Mccrackin S  [facility] Pager [phone]. If 7PM-7AM, please  contact night-coverage at [email or web address], password [email or web address] 07/27/2014, 4:30 PM  LOS: 1 day

## 2014-07-27 NOTE — Progress Notes (Signed)
Pt transported from ED to 4th floor, oriented to room, unit, and equipment. Pt moved over to bed with little assistance. VSS at this time. Belongings at bedside. Will continue to monitor pt closely. Mardene CelesteAsaro, Sava Proby I

## 2014-07-27 NOTE — Progress Notes (Signed)
Upper Gastro Intestinal series to be done at noon today. At 1300 the patient's daughter asked why it had not been done. At this point I called the radiology department to find out but the order had been cancelled by the surgeon. When this happened the patient's daughter got very upset and wanted to talk to the surgeon. Surgery PA had already seen the patient while the daughter was out and wrote in his progress note that the surgeon would be by to see her later in the day and cardiology would be by to clear the patient if surgery was to be done. This information was relayed to the patient's daughter. Through out the afternoon the daughter was getting more and more upset because the surgeon had not come and that the patient could not have anything by mouth (due to the gastro obstruction). I reinforced that fact that putting fluids by mouth would further aggravate the abdominal discomfort. The patient's daughter continued to get very upset because the surgeon had not seen the patient. I paged the on call surgeon at 5:00. He returned my call and I explained the situation. He was able to talk to the attending surgeon and find out what the plan was to be for the patient. I relayed this information to the patient's daughter. Through out this situation she made accusations to my self and my orientee nurse about "eye rolling" and "keeping secrets." She had also taken it upon herself to call the [facility] and [facility] to have the doctors paged.

## 2014-07-27 NOTE — Progress Notes (Signed)
Pt's blood pressure has been in the 190's this afternoon. Gave PRN Hydralizine dose and there was no change. MD notified and he ordered Norvasc. When attempting to give Norvasc the patient vomited and was unable to keep the pill down. On call MD paged for IV blood pressure medication. Julio SicksK. Lauren Aguayo RN

## 2014-07-27 NOTE — Consult Note (Signed)
Referring Provider: Drusilla KannerG. Lama Primary Care Physician:  Carlota RaspberryE. Barnes Primary Gastroenterologist:  Dr. Dulce Sellarutlaw  Reason for Consultation:  Chest pain, paraesophageal hiatal hernia  HPI: [name] is a 80 y.o. female who was just discharged from the hospital yesterday and, ironically, readmitted for the same symptoms yesterday evening.   Specifically, she had been admitted last week with chest pain and vomiting and minimal hematemesis, in the setting of a known, long-standing paraesophageal hiatal hernia, and underwent endoscopic evaluation by Dr. Ewing SchleinMagod which showed "pinching" in the pyloric region; there was some retained food and fluid, but no source of clinically significant bleeding.   Surgical repair of her hernia was contemplated but her symptoms improved and she did not want to have surgery, so her diet was advanced and she was sent home yesterday, as noted.   Unfortunately, as noted above, she had the prompt return of symptoms and thus came back to the emergency room.   A CT scan in the emergency room yesterday, interestingly, looked radiographically similar to what she had on a previous CT 4 years earlier. There was a lot of fluid in the stomach, but no evidence of gastric infarction, gastric volvulus, pneumoperitoneum, etc.   The patient has had a miserable night with ongoing chest discomfort and she states she is spitting up "gobs" of blood, although without any drop in hemoglobin or hemodynamic instability.    Past Medical History  Diagnosis Date  . GERD (gastroesophageal reflux disease)   . Hyperlipidemia   . Moderate aortic stenosis   . HTN (hypertension)   . Heart murmur   . Hernia, epigastric     Past Surgical History  Procedure Laterality Date  . Appendectomy    . Cataract surgery    . Cholecystectomy    . Anterior cervical decomp/discectomy fusion    . Esophagogastroduodenoscopy N/A 07/21/2014    Procedure: ESOPHAGOGASTRODUODENOSCOPY (EGD);  Surgeon: Petra KubaMarc E Magod,  MD;  [facility];  Service: Endoscopy;  Laterality: N/A;    Prior to Admission medications   Medication Sig Start Date End Date Taking? Authorizing Provider  dextromethorphan-guaiFENesin (MUCINEX DM) 30-600 MG per 12 hr tablet Take 1 tablet by mouth daily as needed for cough (cough).    Yes Historical Provider, MD  Docusate Sodium (DSS) 100 MG CAPS Take 100 mg by mouth 2 (two) times daily as needed (constipation). 08/13/2014  Yes Ripudeep Jenna LuoK Rai, MD  loratadine (CLARITIN) 10 MG tablet Take 10 mg by mouth daily as needed for allergies (allergies).    Yes Historical Provider, MD  pantoprazole (PROTONIX) 40 MG tablet Take 1 tablet (40 mg total) by mouth 2 (two) times daily before a meal. 07/30/2014  Yes Ripudeep K Rai, MD  pregabalin (LYRICA) 75 MG capsule Take 1 capsule (75 mg total) by mouth 2 (two) times daily. 07/18/2014  Yes Ripudeep Jenna LuoK Rai, MD  Simethicone (GAS-X EXTRA STRENGTH PO) Take 1 tablet by mouth daily as needed (gas).    Yes Historical Provider, MD  predniSONE (DELTASONE) 10 MG tablet Prednisone dosing: Take  Prednisone 40mg  (4 tabs) x 1 days, then taper to 30mg  (3 tabs) x 2  days, then 20mg  (2 tabs) x 2days, then 10mg  (1 tab) x 2 days, then OFF. 07/27/14   Ripudeep K Rai, MD  traMADol (ULTRAM) 50 MG tablet Take 1 tablet (50 mg total) by mouth every 6 (six) hours as needed for moderate pain. 08/09/2014   Ripudeep Jenna LuoK Rai, MD    Current Facility-Administered Medications  Medication Dose  Route Frequency Provider Last Rate Last Dose  . 0.9 %  sodium chloride infusion   Intravenous Continuous Dorothea Ogle, MD 50 mL/hr at 07/27/14 0106    . acetaminophen (TYLENOL) tablet 650 mg  650 mg Oral Q6H PRN Dorothea Ogle, MD       Or  . acetaminophen (TYLENOL) suppository 650 mg  650 mg Rectal Q6H PRN Dorothea Ogle, MD      . antiseptic oral rinse (CPC / CETYLPYRIDINIUM CHLORIDE 0.05%) solution 7 mL  7 mL Mouth Rinse BID Dorothea Ogle, MD      . docusate sodium (COLACE) capsule 100 mg  100 mg  Oral BID PRN Dorothea Ogle, MD      . HYDROmorphone (DILAUDID) injection 1 mg  1 mg Intravenous Q3H PRN Dorothea Ogle, MD      . loratadine (CLARITIN) tablet 10 mg  10 mg Oral Daily PRN Dorothea Ogle, MD      . ondansetron [facility]) injection 4 mg  4 mg Intravenous Q6H PRN Dorothea Ogle, MD   4 mg at 07/27/14 0237  . oxyCODONE-acetaminophen (PERCOCET/ROXICET) 5-325 MG per tablet 1-2 tablet  1-2 tablet Oral Q3H PRN Dorothea Ogle, MD      . pantoprazole (PROTONIX) injection 40 mg  40 mg Intravenous Q12H Dorothea Ogle, MD   40 mg at 07/27/14 0131  . [START ON 07/30/2014] predniSONE (DELTASONE) tablet 10 mg  10 mg Oral Q breakfast Dorothea Ogle, MD      . Melene Muller ON 07/16/2014] predniSONE (DELTASONE) tablet 20 mg  20 mg Oral Q breakfast Dorothea Ogle, MD      . Melene Muller ON 08/06/2014] predniSONE (DELTASONE) tablet 30 mg  30 mg Oral Q breakfast Dorothea Ogle, MD      . pregabalin (LYRICA) capsule 75 mg  75 mg Oral BID Dorothea Ogle, MD      . promethazine (PHENERGAN) injection 12.5 mg  12.5 mg Intravenous Q6H PRN Meredeth Ide, MD      . simethicone (MYLICON) chewable tablet 80 mg  80 mg Oral QID PRN Dorothea Ogle, MD      . sodium chloride 0.9 % injection 3 mL  3 mL Intravenous Q12H Dorothea Ogle, MD   3 mL at 07/27/14 0106  . traMADol (ULTRAM) tablet 50 mg  50 mg Oral Q6H PRN Dorothea Ogle, MD        Allergies as of 07/30/2014 - Review Complete 07/27/2014  Allergen Reaction Noted  . Codeine Nausea Only 10/18/2011    Family History  Problem Relation Age of Onset  . Coronary artery disease      Postive in family    History   Social History  . Marital Status: Widowed    Spouse Name: N/A    Number of Children: 2  . Years of Education: N/A   Occupational History  . teacher     retired   Social History Main Topics  . Smoking status: Never Smoker   . Smokeless tobacco: Not on file  . Alcohol Use: No  . Drug Use: Not on file  . Sexual Activity: Not on file   Other Topics Concern  . Not  on file   Social History Narrative  . No narrative on file    Review of Systems: At home, the patient is ambulatory. Her hernia had not been giving her significant symptoms until recently.  Physical Exam: Vital signs in last 24  hours: Temp:  [98.2 F (36.8 C)-98.9 F (37.2 C)] 98.2 F (36.8 C) (10/13 0049) Pulse Rate:  [57-83] 82 (10/13 0049) Resp:  [18-20] 20 (10/13 0049) BP: (163-195)/(72-80) 179/74 mmHg (10/13 0049) SpO2:  [93 %-95 %] 95 % (10/13 0049) Weight:  [69.7 kg (153 lb 10.6 oz)] 69.7 kg (153 lb 10.6 oz) (10/13 0049) Last BM Date: 07/25/14  This is an elderly, somewhat frail-appearing, despite that Caucasian female, who nonetheless is not in any evident acute distress. She is anicteric and without pallor. Skin is warm and dry. Radial pulses regular and full. The chest has a few inspiratory crackles on the right side, no wheezes. Heart has a systolic murmur consistent with her history of aortic stenosis, although not particularly harsh in character. Rhythm is regular, rate is normal, no gallop appreciated. The abdomen is nondistended, has normal bowel sounds, and no guarding, mass effect, or tenderness  Intake/Output from previous day: 10/12 0701 - 10/13 0700 In: 495 [I.V.:295; IV Piggyback:200] Out: 550 [Urine:550] Intake/Output this shift:    Lab Results:  Recent Labs  07/25/14 0530 07/15/2014 1938 07/27/14 0415  WBC 13.6* 12.8* 12.9*  HGB 10.8* 12.7 12.7  HCT 32.4* 37.9 37.0  PLT 263 354 338   BMET  Recent Labs  07/25/14 0530 08/08/2014 1938 07/27/14 0415  NA 141 141 139  K 3.8 3.3* 3.2*  CL 105 101 98  CO2 23 23 26   GLUCOSE 111* 133* 117*  BUN 8 15 13   CREATININE 0.72 0.68 0.68  CALCIUM 8.3* 9.1 8.7   LFT  Recent Labs  07/27/14 0415  PROT 7.1  ALBUMIN 3.0*  AST 15  ALT 18  ALKPHOS 65  BILITOT 0.3   PT/INR  Recent Labs  07/27/14 0810  LABPROT 13.5  INR 1.02    Studies/Results: Ct Abdomen Pelvis W Contrast  07/27/2014    CLINICAL DATA:  Recurrent upper abdominal pain and vomiting with recent upper GI bleed and partial gastric volvulus.  EXAM: CT ABDOMEN AND PELVIS WITH CONTRAST  TECHNIQUE: Multidetector CT imaging of the abdomen and pelvis was performed using the standard protocol following bolus administration of intravenous contrast.  CONTRAST:  50mL OMNIPAQUE IOHEXOL 300 MG/ML SOLN, OMNIPAQUE IOHEXOL 300 MG/ML SOLN  COMPARISON:  09/07/2010  FINDINGS: BODY Montero: Unremarkable.  LOWER CHEST: There is paraesophageal herniation which has been previously demonstrated (09/07/2010 CT and 09/11/2010 esophagram). Portions of the distal stomach are not imaged due to high herniation into the chest. As before, the stomach is distended with fluid, with relative decompression of small bowel. This is likely obstruction at the proximal duodenum which at the level of the hiatus. There is no evidence of gastric necrosis or perforation. The orientation of the stomach is unchanged (ie no acute volvulus).  Mitral and aortic annular calcification. There are small bilateral pleural effusions which appears simple. Dependent atelectasis bilaterally.  ABDOMEN/PELVIS:  Liver: No focal abnormality.  Biliary: Cholecystectomy with chronic enlargement of the biliary tree, likely reservoir effect.  Pancreas: Atrophy without ductal enlargement or visible mass.  Spleen: Small size.  No focal abnormality.  Adrenals: Unremarkable.  Kidneys and ureters: No hydronephrosis or stone.  Bladder: Low positioning consistent with pelvic floor laxity.  Reproductive: Hysterectomy.  No adnexal mass.  Bowel: No obstruction. No pericecal inflammation.  Peritoneum: No ascites or pneumoperitoneum.  Vascular: No acute abnormality.  OSSEOUS: No acute abnormalities.  IMPRESSION: Chronic, large paraesophageal hiatal hernia with obstruction at the pylorus. Similar findings noted by CT 09/07/2010.   Electronically Signed  By: Tiburcio PeaJonathan  Watts M.D.   On: 07/27/2014 00:39     Impression: 1. Paraesophageal hiatal hernia with essentially intrathoracic stomach 2. Apparent anatomic gastric outlet obstruction related to where the duodenum passes through the diaphragm with this abnormal anatomy  Plan: 1. Upper GI series today to try to further define the anatomy and the emptying dynamics of her stomach 2. Following that study, NG suction might give the patient some transient symptomatic relief. 3. In the absence of acute exsanguinating hemorrhage, I do not feel that repeating her endoscopy, which was just done a few days ago, would likely offer any benefit to the patient. 4. On the other hand, this appears to be entirely an anatomic problem, for which a mechanical solution (i.e., surgery) seems to be the most logical course of action. With the patient's and her daughter's approval, I have arranged for surgical re-consultation. Hopefully, she could have laparoscopic repair of this problem. The patient states "I'm going to die" and seems to think that she is not an operative candidate, but in reality, she drives a car, goes to church, and lives by herself. Despite her advanced age and aortic stenosis, I don't see any obvious reason why surgery could not at least be considered from the anesthesia perspective, although clearly preoperative anesthesia and possibly cardiology assessment might be needed. 5. I will follow at a distance with you, but realistically, I do not feel that there is a lot to offer this patient from the GI perspective. She either needs surgery, or NG suction and palliative care.    LOS: 1 day   Zamya Culhane V  07/27/2014, 9:47 AM

## 2014-07-27 NOTE — Progress Notes (Signed)
1743 Pt's daughter [name](Mary) noted coming out of pt's room into hallway upset & speaking loudly to staff regarding why she hasn't received any answers from the surgeon since her mother has been here. Pt's daughter [name](Mary) pacing, speaking unkindly to staff and cursing in the hallway. Hospitalist and surgeon on call both notified and made aware of daughter's request.

## 2014-07-28 ENCOUNTER — Inpatient Hospital Stay (HOSPITAL_COMMUNITY): Payer: Medicare Other

## 2014-07-28 LAB — BLOOD GAS, ARTERIAL
Acid-base deficit: 2.2 mmol/L — ABNORMAL HIGH (ref 0.0–2.0)
Bicarbonate: 24 mEq/L (ref 20.0–24.0)
Drawn by: 232811
FIO2: 1 %
O2 Saturation: 69.7 %
PATIENT TEMPERATURE: 97.6
PCO2 ART: 47.5 mmHg — AB (ref 35.0–45.0)
PH ART: 7.32 — AB (ref 7.350–7.450)
PO2 ART: 41.8 mmHg — AB (ref 80.0–100.0)
TCO2: 21.8 mmol/L (ref 0–100)

## 2014-07-28 MED ORDER — MORPHINE SULFATE 2 MG/ML IJ SOLN
INTRAMUSCULAR | Status: AC
  Administered 2014-07-28: 2 mg via INTRAVENOUS
  Filled 2014-07-28: qty 1

## 2014-07-28 MED ORDER — FUROSEMIDE 10 MG/ML IJ SOLN
INTRAMUSCULAR | Status: AC
  Administered 2014-07-28: 40 mg
  Filled 2014-07-28: qty 4

## 2014-07-28 MED ORDER — MORPHINE SULFATE 2 MG/ML IJ SOLN
2.0000 mg | Freq: Once | INTRAMUSCULAR | Status: AC
  Administered 2014-07-28: 2 mg via INTRAVENOUS

## 2014-07-28 MED ORDER — FUROSEMIDE 10 MG/ML IJ SOLN: 40.0000 mg | Freq: Once | INTRAMUSCULAR | Status: AC

## 2014-07-28 MED ORDER — MORPHINE SULFATE 2 MG/ML IJ SOLN
INTRAMUSCULAR | Status: AC
  Administered 2014-07-28: 2 mg via INTRAMUSCULAR
  Filled 2014-07-28: qty 1

## 2014-07-28 MED ORDER — MORPHINE SULFATE 2 MG/ML IJ SOLN: 2.0000 mg | Freq: Once | INTRAMUSCULAR | Status: AC

## 2014-07-28 MED ORDER — MORPHINE SULFATE 2 MG/ML IJ SOLN: 2.0000 mg | INTRAMUSCULAR | Status: DC | PRN

## 2014-08-15 NOTE — Progress Notes (Addendum)
Asked to join family conference concerning the patient's death.  I was there for the last half hour.  ICU nurses, hospital administration, primary attmitting physician, Dr. Mahala MenghiniSamtani, present.  Daughter in female family member (?Son) in room.  Daughter very frustrated, angry, tearful.  Numerous concerns.  We tried to empathaize with this unfortunate situation.  We worked to try and answer questions and concerns.    Ardeth SportsmanSteven C. Davisha Linthicum, M.D., F.A.C.S. Gastrointestinal and Minimally Invasive Surgery [facility] [address], [address] [place], [place] [address] [phone] Main / Paging

## 2014-08-15 NOTE — Progress Notes (Addendum)
Events noted My partner, Dr Derrell Lollingamirez, d/w nureses x 2 yesterday evening about d/c'ing the UGI & trying to NGT placed. D/W ICU RNs Most likely had massive aspiration event that caused the death. Unfortunate situation...  Alexandra SportsmanSteven C. Caileen [name], M.D., F.A.C.S. Gastrointestinal and Minimally Invasive Surgery [facility] [address], [address] [place], [place] [address] [phone] Main / Paging

## 2014-08-15 NOTE — Progress Notes (Signed)
Report given at bedside by Lillia AbedLindsay from 4th floor.

## 2014-08-15 NOTE — Discharge Summary (Addendum)
Death Summary  Hazel Samsollyanna E Dabney ZOX:[id number] DOB: 1918-10-14 DOA: 08/09/2014  PCP: Freddy JakschUTLAW,WILLIAM M, MD  Admit date: 08/09/2014 Date of Death: 08/13/2014  Final Diagnoses:  Principal Problem:   Abdominal pain Active Problems:   HTN (hypertension)   Leukocytosis   Hypokalemia   Paraesophageal hiatal hernia   History of present illness:   Please note that this examiner started as attending physician 07/30/2014 a.m. on the day of her demise  80 year old female with history of GERD, hiatal hernia, moderate aortic stenosis, hypertension, admitted 10/7-10/12 2015 GI bleed + hematemesis as well as partial gastric volvulus (determined not to be surgical candidate given this was a long-standing complaint per note by general surgery 07/21/2014).  In either event-patient went home on 10/12 and returned later that evening with significant hematemesis, distress and abdominal pain  Her emergency room workup revealed WBC 12.8 , Hg 12.7 (up from 10.8 on Oct 11th, 2015).   [facility] asked to admit for further evaluation and [facility] consulted ( Dr. Matthias HughsBuccini) as well as general surgery Dr. Karie SodaSteven Gross.  It was thought that patient may benefit from some type of surgery subsequently as based on the CT most of the upper stomach and distal stomach and duodenal bulb were now herniating up into the chest.  On the evening of 10/13 early morning of 10/14, patient was noted to have multiple vomiting at an vitals are stable but subsequently patient started to desaturate and blood pressure started to fall. Rapid response was called and evaluated the patient and nurse practitioner had arrived prior to me getting there, as it was 7:10 a.m. that called who had been called.  Patient had been made DO NOT RESUSCITATE by her daughter and nurse practitioner after an extensive discussions and I went over this with family,  Patient was given 1-2 mg of IV morphine patient subsequently passed away around 7:40  I addressed multiple  concerns from the patient's family.  I reinforced that we were planning to consider surgery but based on my review of events as I have not been the attending physician, I completely agreed with the thorough and detailed workup that was needed prior to any surgical intervention from a hospitalist standpoint given her advanced age.  I spent over an hour explaining what might have been the causes of this happening in terms of aspiration, Dr. Michaell CowingGross of general surgery was also present and did reiterate that his plan was to consider surgery once she had been stabilized which clearly she had not been and cardiology evaluation at that yet been performed in terms of preoperative evaluation.  Daughter expressed her dissatisfaction extremely rudely to both myself and Dr. Michaell CowingGross, and there are multiple instances of inappropriate language being used  towards other members of staff earlier in this difficult situation.  All questions were answered however as well as adequate amount of time was given for her daughter to express her feelings, and condolences were relayed to her  Time: 1485  Signed:  Rhetta MuraSAMTANI, JAI-GURMUKH  [facility] 08/01/2014, 7:29 AM

## 2014-08-15 NOTE — Progress Notes (Signed)
RN paged this NP around 0630 hrs secondary to pt desatting into the 60s. NRB placed and O2 sat up to mid 70s only.  NP to bedside. S: pt can not participate in ROS secondary to mental status.  O: Very frail, chronically ill, acutely ill appearing elderly WF in respiratory distress. NRB on and O2 sats in the 70s. Increased WOB noted with RR in high 20s to 30s. Diminished breath sounds at bases. CXR with LLL PNA. RRR. MOE x 4. She is lethargic but fights mask and says "let me breathe". Pale but not cyanotic.  A/P: 1. Acute resp failure secondary to HCAP/? aspiration-ABG showed slightly acidotic pH, slightly elevated PCO2. PO2 41.8. Moved stat to ICU. This NP had spoke to daughter, Corrie DandyMary, on telephone who wanted "everything done" including intubation and CPR. Daughter on way to hospital.  Arrived in ICU. Pt's sat only in upper 60s on bipap. PCCM called. Informed at that time that pt was a FULL CODE per daughter and would probably need to be intubated. Informed by PCCM to call anesthesia to do that and PCCM would come to see pt.  Then, pt's BP dropped into the the 50s systolic and O2 sat continued in the 60s. Code team called. Pt bagged with 100% O2 and suctioned for dark brown secretions.  Daughter arrived. This NP had discussion with daughter about her mother's state and the gravity of the situation. That with the pt's age, coupled with her chronic and acute medical issues, along with new finding of likely PNA, that she would likely not be able to be resuscitated successfully. Daughter was understandably upset, but stated her mother "did not want to be on a machine or be a vegetable if there was nothing to do" for her. With daughter's permission, the code was called/stopped although pt never lost pulse or ceased respirations. Daughter escorted to bedside. Explained monitor findings. Supported by staff and Lisabeth RegisterHans, RN, Consulting civil engineercharge RN. After further explanation, daughter agreed to O2 per [place] since mask was causing  discomfort to pt and MSO4 for comfort.  Dr. Mahala MenghiniSamtani from [facility], attending, here after 7am. Report given to him. At this time, pt is actively dying. Daughter at bedside with her personal clergy. Staff supporting family.  Jimmye NormanKaren Kirby-Graham, NP [facility]

## 2014-08-15 NOTE — Progress Notes (Signed)
MD Samtani pronounced death at bedside, asculated heart. Daughter, pastor, and 2 members of nursing staff at bedside. Cardiac monitor confirmed asystole. Monitor indicated 07:34am.

## 2014-08-15 NOTE — Progress Notes (Signed)
Returned page to the nurse in regards to patient's daughter's questions of the treatment plan.  After discussing the case with Dr. Michaell CowingGross, I relayed to the nurse that the patient was to require cardiac clearance for aortic stenosis, prior to any definitive surgical plans.  The nurse relayed that the pt had an episode of emesis and asked if I could give an order for an NGT.  I agreed that she should try and place a NGT and confirm with a KUB/CXR since the patient's hiatal hernia was likely to create difficulties in confirming the position with auscultation.  The nurse agreed she would attempt to place NGT.

## 2014-08-15 NOTE — Progress Notes (Signed)
Currently on 5L nasal cannula, 2 mg of morphine given. Patient still currently on respiratory distress. Heartrate declining at 68, oxygen unreadable. Daughter and pastor at bedside along with ICU nursing team, and MD. Patient's daughter expressing concerns of surgical team's efforts within past 24-48 hours. Now at bedside providing comfort to family and patient.

## 2014-08-15 NOTE — Progress Notes (Signed)
Earlier this morning, techs went into patient's room to clean her up. Patient was more lethargic. Vital signs were taken and oxygen sat's were in the 60's on RA. A nonrebreather was put on patient and at that time patient started fighting the mask. The rapid response RN was called, the NP on call was called, and respiratory was called as well. Patient was oriented to self, but not oriented to place. Chest xray was taken, and then NP ordered IV lasix and IV morphine and then to transfer the patient so they could be put on Bipap. Patient's daughter was called by a second RN to make her aware of what was going on, and NP talked to patient's daughter as well. At 0645,patient was transferred down to ICU and put on Bipap. Then RN gave report to the RN taking the patient for the day.

## 2014-08-15 NOTE — Progress Notes (Signed)
Patient had a very large loose brown bowel movement this morning. Patient states she is not in any pain right now and not having any nausea right now. Will continue to monitor.

## 2014-08-15 NOTE — Progress Notes (Signed)
Family with concerns requesting to speak with physicians for answers. Text-paged Samtani.

## 2014-08-15 NOTE — Significant Event (Signed)
Events noted.  >1 hour discussion with family about events  See death summary.   Pleas KochJai Mariem Skolnick, MD [facility] [phone](P) [phone]

## 2014-08-15 NOTE — Progress Notes (Signed)
On arrival of room, respiratory, Charge RN, and NP at bedside. Patient in acute respiratory distress, O2 sats reading 58% on BiPap, heartrate 140s and BP 62/36. NP requested fluids being pushed in. NP stated to pull code. During beginning phases of code, NP spoke with daughter and advised to stop resuscitation. ICU team and NP, with daughter at beside, are currently providing O2.

## 2014-08-15 DEATH — deceased

## 2014-10-28 ENCOUNTER — Encounter (HOSPITAL_COMMUNITY): Payer: Self-pay | Admitting: Gastroenterology

## 2019-08-16 DEATH — deceased
